# Patient Record
Sex: Male | Born: 1965 | Race: Black or African American | Hispanic: No | Marital: Married | Smoking: Current every day smoker
Health system: Southern US, Community
[De-identification: ages and names within clinical notes are randomized; demographics above are authoritative.]

## PROBLEM LIST (undated history)

## (undated) DIAGNOSIS — E119 Type 2 diabetes mellitus without complications: Secondary | ICD-10-CM

## (undated) DIAGNOSIS — I10 Essential (primary) hypertension: Secondary | ICD-10-CM

---

## 2009-06-10 ENCOUNTER — Emergency Department: Payer: Self-pay | Admitting: Internal Medicine

## 2014-09-29 ENCOUNTER — Emergency Department
Admission: EM | Admit: 2014-09-29 | Discharge: 2014-09-29 | Disposition: A | Discharge status: Home / Self Care | Payer: BLUE CROSS/BLUE SHIELD | Attending: Emergency Medicine | Admitting: Emergency Medicine

## 2014-09-29 ENCOUNTER — Encounter: Payer: Self-pay | Admitting: Emergency Medicine

## 2014-09-29 DIAGNOSIS — I10 Essential (primary) hypertension: Secondary | ICD-10-CM | POA: Diagnosis not present

## 2014-09-29 DIAGNOSIS — S99922A Unspecified injury of left foot, initial encounter: Secondary | ICD-10-CM | POA: Diagnosis present

## 2014-09-29 DIAGNOSIS — Y998 Other external cause status: Secondary | ICD-10-CM | POA: Insufficient documentation

## 2014-09-29 DIAGNOSIS — S90812A Abrasion, left foot, initial encounter: Secondary | ICD-10-CM | POA: Insufficient documentation

## 2014-09-29 DIAGNOSIS — Y9289 Other specified places as the place of occurrence of the external cause: Secondary | ICD-10-CM | POA: Insufficient documentation

## 2014-09-29 DIAGNOSIS — W5559XA Other contact with raccoon, initial encounter: Secondary | ICD-10-CM | POA: Diagnosis not present

## 2014-09-29 DIAGNOSIS — Y9389 Activity, other specified: Secondary | ICD-10-CM | POA: Insufficient documentation

## 2014-09-29 DIAGNOSIS — Z23 Encounter for immunization: Secondary | ICD-10-CM | POA: Diagnosis not present

## 2014-09-29 DIAGNOSIS — Z720 Tobacco use: Secondary | ICD-10-CM | POA: Insufficient documentation

## 2014-09-29 DIAGNOSIS — E119 Type 2 diabetes mellitus without complications: Secondary | ICD-10-CM | POA: Insufficient documentation

## 2014-09-29 DIAGNOSIS — Z203 Contact with and (suspected) exposure to rabies: Secondary | ICD-10-CM

## 2014-09-29 HISTORY — DX: Essential (primary) hypertension: I10

## 2014-09-29 HISTORY — DX: Type 2 diabetes mellitus without complications: E11.9

## 2014-09-29 MED ORDER — RABIES VACCINE, PCEC IM SUSR
1.0000 mL | Freq: Once | INTRAMUSCULAR | Status: AC
  Administered 2014-09-29: 1 mL via INTRAMUSCULAR
  Filled 2014-09-29: qty 1

## 2014-09-29 MED ORDER — RABIES IMMUNE GLOBULIN 150 UNIT/ML IM INJ
20.0000 [IU]/kg | INJECTION | Freq: Once | INTRAMUSCULAR | Status: AC
  Administered 2014-09-29: 2325 [IU] via INTRAMUSCULAR
  Filled 2014-09-29: qty 15.5

## 2014-09-29 NOTE — ED Provider Notes (Signed)
[facility] Emergency Department Provider Note ____________________________________________  Time seen: Approximately 11:29 AM  I have reviewed the triage vital signs and the nursing notes.   HISTORY  Chief Complaint Animal Bite   HPI Fernando Evans is a 49 y.o. male who presents to the emergency department for evaluation after being scratched/bitten by a raccoon last night while working on a vehicle. He states that he was laying on the ground with his foot extended out toward a bush and felt something grab his foot. He initially thought it was his son playing but then realized that something was trying to bite him. He was wearing socks but felt something scratch his left foot. He doesn't know whether it was a tooth or a claw. He states he then grabbed a milk crate and killed the raccoon. Animal control was contacted and they now have the animal for testing.   Past Medical History  Diagnosis Date  . Hypertension   . Diabetes mellitus without complication     There are no active problems to display for this patient.   History reviewed. No pertinent past surgical history.  No current outpatient prescriptions on file.  Allergies Review of patient's allergies indicates no known allergies.  No family history on file.  Social History Social History  Substance Use Topics  . Smoking status: Current Every Day Smoker  . Smokeless tobacco: None  . Alcohol Use: Yes    Review of Systems   Constitutional: No fever/chills Eyes: No visual changes. ENT: No congestion or rhinorrhea Cardiovascular: Denies chest pain. Respiratory: Denies shortness of breath. Gastrointestinal: No abdominal pain.  No nausea, no vomiting.  No diarrhea.  No constipation. Genitourinary: Negative for dysuria. Musculoskeletal: Negative for back pain. Skin: Abrasion to the left foot Neurological: Negative for headaches, focal weakness or numbness.  10-point ROS otherwise  negative.  ____________________________________________   PHYSICAL EXAM:  VITAL SIGNS: ED Triage Vitals  Enc Vitals Group     BP --      Pulse --      Resp --      Temp --      Temp src --      SpO2 --      Weight 09/29/14 1105 254 lb (115.214 kg)     Height 09/29/14 1105  (1.753 m)     Head Cir --      Peak Flow --      Pain Score 09/29/14 0953 0     Pain Loc --      Pain Edu? --      Excl. in GC? --     Constitutional: Alert and oriented. Well appearing and in no acute distress. Eyes: Conjunctivae are normal. PERRL. EOMI. Head: Atraumatic. Nose: No congestion/rhinnorhea. Mouth/Throat: Mucous membranes are moist.  Oropharynx non-erythematous. No oral lesions. Neck: No stridor. Cardiovascular: Normal rate, regular rhythm.  Good peripheral circulation. Respiratory: Normal respiratory effort.  No retractions. Lungs CTAB. Gastrointestinal: Soft and nontender. No distention.  Musculoskeletal: No lower extremity tenderness nor edema.  No joint effusions. Neurologic:  Normal speech and language. No gross focal neurologic deficits are appreciated. Speech is normal. No gait instability. Skin: Abrasion to the  dorsal lateral aspect of the left foot without appearance of infection or cellulitis.  Psychiatric: Mood and affect are normal. Speech and behavior are normal.  ____________________________________________   LABS (all labs ordered are listed, but only abnormal results are displayed)  Labs Reviewed - No data to display ____________________________________________  EKG  ____________________________________________  RADIOLOGY  Not indicated ____________________________________________   PROCEDURES  Procedure(s) performed: None ____________________________________________   INITIAL IMPRESSION / ASSESSMENT AND PLAN / ED COURSE  Pertinent labs & imaging results that were available during my care of the patient were reviewed by me and considered in my  medical decision making (see chart for details).  Patient to start rabies series. He was advised to return in 4 days for the second injection unless animal control notifies him that the animal was not rabid. He was also advised to observe the abrasion area for any sign of infection and return to the emergency department or see her primary care provider if he has concerns. ____________________________________________   FINAL CLINICAL IMPRESSION(S) / ED DIAGNOSES  Final diagnoses:  Need for post exposure prophylaxis for rabies       Chinita Pester, FNP 09/29/14 1134  Jene Every, MD 09/29/14 1309

## 2014-09-29 NOTE — ED Notes (Signed)
States he felt a raccoon bite to left foot yesterday    Here for possible rabies vaccine

## 2014-09-29 NOTE — Discharge Instructions (Signed)
Rabies  Rabies is a viral infection that can be spread to people from infected animals. The infection affects the brain and central nervous system. Once the disease develops, it almost always causes death. Because of this, when a person is bitten by an animal that may have rabies, treatment to prevent rabies often needs to be started whether or not the animal is known to be infected. Prompt treatment with the rabies vaccine and rabies immune globulin is very effective at preventing the infection from developing in people who have been exposed to the rabies virus. CAUSES  Rabies is caused by a virus that lives inside some animals. When a person is bitten by an infected animal, the rabies virus is spread to the person through the infected spit (saliva) of the animal. This virus can be carried by animals such as dogs, cats, skunks, bats, woodchucks, raccoons, coyotes, and foxes. SYMPTOMS  By the time symptoms appear, rabies is usually fatal for the person. Common symptoms include:  Headache.  Fever.  Fatigue and weakness.  Agitation.  Anxiety.  Confusion.  Unusual behavior, such as hyperactivity, fear of water (hydrophobia), or fear of air (aerophobia).  Hallucinations.  Insomnia.  Weakness in the arms or legs.  Difficulty swallowing. Most people get sick in 1-3 months after being bitten. This often varies and may depend on the location of the bite. The infection will take less time to develop if the bite occurred closer to the head.  DIAGNOSIS  To determine if a person is infected, several tests must be performed, such as:  A skin biopsy.  A saliva test.  A lumbar puncture to remove spinal fluid so it can be examined.  Blood tests. TREATMENT  Treatment to prevent the infection from developing (post-exposure prophylaxis, PEP) is often started before knowing for sure if the person has been exposed to the rabies virus. PEP involves cleaning the wound, giving an antibody injection  (rabies immune globulin), and giving a series of rabies vaccine injections. The series of injections are usually given over a two-week period. If possible, the animal that bit the person will be observed to see if it remains healthy. If the animal has been killed, it can be sent to a state laboratory and examined to see if the animal had rabies. If a person is bitten by a domestic animal (dog, cat, or ferret) that appears healthy and can be observed to see if it remains healthy, often no further treatment is necessary other than care of the wounds caused by the animal. Rabies is often a fatal illness once the infection develops in a person. Although a few people who developed rabies have survived after experimental treatment with certain drugs, all these survivors still had severe nervous system problems after the treatment. This is why caregivers use extra caution and begin PEP treatment for people who have been bitten by animals that are possibly infected with rabies.  HOME CARE INSTRUCTIONS  If you were bitten by an unknown animal, make sure you know your caregiver's instructions for follow-up. If the animal was sent to a laboratory for examination, ask when the test results will be ready. Make sure you get the test results.  Take these steps to care for your wound:  Keep the wound clean, dry, and dressed as directed by your caregiver.  Keep the injured part elevated as much as possible.  Do not resume use of the affected area until directed.  Only take over-the-counter or prescription medicines as directed by   your caregiver.  Keep all follow-up appointments as directed by your caregiver. PREVENTION  To prevent rabies, people need to reduce their risk of having contact with infected animals.   Make sure your pets (dogs, cats, ferrets) are vaccinated against rabies. Keep these vaccinations up-to-date as directed by your veterinarian.  Supervise your pets when they are outside. Keep them away  from wild animals.  Call your local animal control services to report any stray animals. These animals may not be vaccinated.  Stay away from stray or wild animals.  Consider getting the rabies vaccine (preexposure) if you are traveling to an area where rabies is common or if your job or activities involve possible contact with wild or stray animals. Discuss this with your caregiver. Document Released: 02/03/2005 Document Revised: 10/29/2011 Document Reviewed: 09/02/2011 Exit[employer] Patient Information 2015 [employer]. This information is not intended to replace advice given to you by your health care provider. Make sure you discuss any questions you have with your health care provider.  

## 2014-09-29 NOTE — ED Notes (Signed)
Pt to ed with c/o raccoon bite to left foot yesterday about 730 pm at night.

## 2014-10-02 ENCOUNTER — Ambulatory Visit
Admission: EM | Admit: 2014-10-02 | Discharge: 2014-10-02 | Disposition: A | Discharge status: Home / Self Care | Payer: BLUE CROSS/BLUE SHIELD | Attending: Family Medicine | Admitting: Family Medicine

## 2014-10-02 DIAGNOSIS — Z203 Contact with and (suspected) exposure to rabies: Secondary | ICD-10-CM

## 2014-10-02 MED ORDER — RABIES VACCINE, PCEC IM SUSR
1.0000 mL | Freq: Once | INTRAMUSCULAR | Status: AC
  Administered 2014-10-02: 1 mL via INTRAMUSCULAR

## 2014-10-02 NOTE — ED Notes (Signed)
For Day 3 Rabies Vaccine

## 2014-10-02 NOTE — Discharge Instructions (Signed)
Please return August 19th, 2016 for day 7 of rabies injection series

## 2014-10-06 ENCOUNTER — Encounter: Payer: Self-pay | Admitting: Gynecology

## 2014-10-06 ENCOUNTER — Ambulatory Visit
Admission: EM | Admit: 2014-10-06 | Discharge: 2014-10-06 | Disposition: A | Discharge status: Home / Self Care | Payer: BLUE CROSS/BLUE SHIELD | Attending: Family Medicine | Admitting: Family Medicine

## 2014-10-06 MED ORDER — RABIES VACCINE, PCEC IM SUSR
1.0000 mL | Freq: Once | INTRAMUSCULAR | Status: AC
  Administered 2014-10-06: 1 mL via INTRAMUSCULAR

## 2014-10-06 NOTE — ED Notes (Signed)
For day 7 rabies vaccination.

## 2014-10-16 ENCOUNTER — Ambulatory Visit
Admission: EM | Admit: 2014-10-16 | Discharge: 2014-10-16 | Disposition: A | Discharge status: Home / Self Care | Payer: BLUE CROSS/BLUE SHIELD | Attending: Family Medicine | Admitting: Family Medicine

## 2014-10-16 MED ORDER — RABIES VACCINE, PCEC IM SUSR
1.0000 mL | Freq: Once | INTRAMUSCULAR | Status: AC
  Administered 2014-10-16: 1 mL via INTRAMUSCULAR

## 2014-10-16 NOTE — ED Notes (Signed)
Bit/scratched August 11th and went to [facility] er and started Rabies series on August 12th. Came to [facility] for Day 3 and Day 7, Day 7 was August 19th. States unsure of when was suppose to return for injection after August 19th. Pt. States was informed the racoon that bit patient was + rabid. Dr. Thurmond Butts gave order for Rabavert to be given today (Day 17).

## 2014-10-16 NOTE — ED Notes (Signed)
Family at bedside. Pt instructed to contact [facility] tomorrow August 30th for instructions as to if and when to receive next Rabavert (Rabies injection). Today was Day 17. Pt. Missed Day 14-calling for clarification

## 2014-10-17 ENCOUNTER — Telehealth: Payer: Self-pay

## 2014-10-17 NOTE — ED Notes (Signed)
This Nurse contacted [facility] and spoke with Charlyne Petrin at [phone]. She spoke with a collegue and they agree that there was not enough deviation in time from when was suppose to get Day 14 Rabies and instead came to [facility] and was given Tabavert on Day 17. No further action-no further Rabies injections required. Pt. Contacted at [phone] and relayed that information.

## 2015-10-25 ENCOUNTER — Emergency Department
Admission: EM | Admit: 2015-10-25 | Discharge: 2015-10-25 | Disposition: A | Discharge status: Home / Self Care | Payer: BLUE CROSS/BLUE SHIELD | Attending: Emergency Medicine | Admitting: Emergency Medicine

## 2015-10-25 ENCOUNTER — Emergency Department: Payer: BLUE CROSS/BLUE SHIELD

## 2015-10-25 DIAGNOSIS — E1165 Type 2 diabetes mellitus with hyperglycemia: Secondary | ICD-10-CM | POA: Diagnosis not present

## 2015-10-25 DIAGNOSIS — Z5181 Encounter for therapeutic drug level monitoring: Secondary | ICD-10-CM | POA: Insufficient documentation

## 2015-10-25 DIAGNOSIS — I10 Essential (primary) hypertension: Secondary | ICD-10-CM | POA: Diagnosis not present

## 2015-10-25 DIAGNOSIS — F17200 Nicotine dependence, unspecified, uncomplicated: Secondary | ICD-10-CM | POA: Diagnosis not present

## 2015-10-25 DIAGNOSIS — R42 Dizziness and giddiness: Secondary | ICD-10-CM | POA: Insufficient documentation

## 2015-10-25 DIAGNOSIS — Z7984 Long term (current) use of oral hypoglycemic drugs: Secondary | ICD-10-CM | POA: Insufficient documentation

## 2015-10-25 DIAGNOSIS — R739 Hyperglycemia, unspecified: Secondary | ICD-10-CM

## 2015-10-25 LAB — CBC
HCT: 44.1 % (ref 40.0–52.0)
Hemoglobin: 15.2 g/dL (ref 13.0–18.0)
MCH: 30.4 pg (ref 26.0–34.0)
MCHC: 34.5 g/dL (ref 32.0–36.0)
MCV: 88.3 fL (ref 80.0–100.0)
Platelets: 246 10*3/uL (ref 150–440)
RBC: 5 MIL/uL (ref 4.40–5.90)
RDW: 13.8 % (ref 11.5–14.5)
WBC: 8.7 10*3/uL (ref 3.8–10.6)

## 2015-10-25 LAB — PROTIME-INR
INR: 0.97
Prothrombin Time: 12.9 seconds (ref 11.4–15.2)

## 2015-10-25 LAB — BASIC METABOLIC PANEL
ANION GAP: 5 (ref 5–15)
BUN: 9 mg/dL (ref 6–20)
CALCIUM: 9 mg/dL (ref 8.9–10.3)
CO2: 32 mmol/L (ref 22–32)
CREATININE: 0.79 mg/dL (ref 0.61–1.24)
Chloride: 102 mmol/L (ref 101–111)
GFR calc Af Amer: >60 mL/min (ref >60)
GFR calc non Af Amer: >60 mL/min (ref >60)
Glucose, Bld: 228 mg/dL — ABNORMAL HIGH (ref 65–99)
Potassium: 4.3 mmol/L (ref 3.5–5.1)
SODIUM: 139 mmol/L (ref 135–145)

## 2015-10-25 MED ORDER — AMLODIPINE BESYLATE 10 MG PO TABS: 10.0000 mg | ORAL_TABLET | Freq: Every day | ORAL | 11 refills | Status: AC

## 2015-10-25 MED ORDER — ASPIRIN EC 325 MG PO TBEC: 325.0000 mg | DELAYED_RELEASE_TABLET | Freq: Every day | ORAL | 3 refills | Status: AC

## 2015-10-25 MED ORDER — ASPIRIN 81 MG PO CHEW
324.0000 mg | CHEWABLE_TABLET | Freq: Once | ORAL | Status: AC
  Administered 2015-10-25: 324 mg via ORAL
  Filled 2015-10-25: qty 4

## 2015-10-25 NOTE — ED Notes (Signed)
NAD noted at this time. Pt resting in bed. This RN explained delay to patient and apologized for patient wait. Pt states understanding at this time. Pt is alert and oriented, able to speak in full/complete sentences at this time. Will continue to monitor for further patient needs.

## 2015-10-25 NOTE — ED Provider Notes (Signed)
[facility] Emergency Department Provider Note        Time seen: ----------------------------------------- 3:07 PM on 10/25/2015 -----------------------------------------    I have reviewed the triage vital signs and the nursing notes.   HISTORY  Chief Complaint Hypertension and Dizziness    HPI Fernando Evans is a 50 y.o. male who presents to ER with elevated blood pressure and dizziness. Patient reports he had stopped his metoprolol, amlodipine and lisinopril for several months. Patient states 2 weeks ago he started back because he started feeling tingling on his scalp and he noticed some tingling in his left hand. He may have had some left facial paresthesias as well. Patient is also stopped his diabetes medicines. After restarting his medications 2 weeks ago he sayshis blood pressure was improved but then all of a sudden started increasing again. He denies any symptoms currently.   Past Medical History:  Diagnosis Date  . Diabetes mellitus without complication (HCC)   . Hypertension     There are no active problems to display for this patient.   No past surgical history on file.  Allergies Review of patient's allergies indicates no known allergies.  Social History Social History  Substance Use Topics  . Smoking status: Current Every Day Smoker  . Smokeless tobacco: Not on file  . Alcohol use Yes     Comment: socially    Review of Systems Constitutional: Negative for fever. Cardiovascular: Negative for chest pain. Respiratory: Negative for shortness of breath. Gastrointestinal: Negative for abdominal pain, vomiting and diarrhea. Genitourinary: Negative for dysuria. Musculoskeletal: Negative for back pain. Skin: Negative for rash. Neurological: Negative for headaches,Positive for paresthesias  10-point ROS otherwise negative.  ____________________________________________   PHYSICAL EXAM:  VITAL SIGNS: ED Triage Vitals  Enc  Vitals Group     BP 10/25/15 1108 (!) 174/96     Pulse Rate 10/25/15 1108 74     Resp 10/25/15 1108 18     Temp 10/25/15 1108 98.7 F (37.1 C)     Temp Source 10/25/15 1108 Oral     SpO2 10/25/15 1108 98 %     Weight 10/25/15 1109 250 lb (113.4 kg)     Height 10/25/15 1109 5\' 10"  (1.778 m)     Head Circumference --      Peak Flow --      Pain Score 10/25/15 1112 2     Pain Loc --      Pain Edu? --      Excl. in GC? --     Constitutional: Alert and oriented. Well appearing and in no distress. Eyes: Conjunctivae are normal. PERRL. Normal extraocular movements. ENT   Head: Normocephalic and atraumatic.   Nose: No congestion/rhinnorhea.   Mouth/Throat: Mucous membranes are moist.   Neck: No stridor. Cardiovascular: Normal rate, regular rhythm. No murmurs, rubs, or gallops. Respiratory: Normal respiratory effort without tachypnea nor retractions. Breath sounds are clear and equal bilaterally. No wheezes/rales/rhonchi. Gastrointestinal: Soft and nontender. Normal bowel sounds Musculoskeletal: Nontender with normal range of motion in all extremities. No lower extremity tenderness nor edema. Neurologic:  Normal speech and language. No gross focal neurologic deficits are appreciated. Paresthesias noted in the C8 distribution left hand. Cranial nerves appear to be intact, strength is normal Skin:  Skin is warm, dry and intact. No rash noted. Psychiatric: Mood and affect are normal. Speech and behavior are normal.  ____________________________________________  EKG: Interpreted by me.Sinus rhythm rate of 74 bpm, normal PR interval, normal QRS, normal QT interval.  Normal axis. No evidence of acute infarction  ____________________________________________  ED COURSE:  Pertinent labs & imaging results that were available during my care of the patient were reviewed by me and considered in my medical decision making (see chart for details). Clinical Course  Patient is in no  distress, we will assess with basic labs and monitor his blood pressure. I will also perform a head CT to evaluate for CVA.  Procedures ____________________________________________   LABS (pertinent positives/negatives)  Labs Reviewed  BASIC METABOLIC PANEL - Abnormal; Notable for the following:       Result Value   Glucose, Bld 228 (*)    All other components within normal limits  CBC  PROTIME-INR    RADIOLOGY  CT head Is unremarkable ____________________________________________  FINAL ASSESSMENT AND PLAN  Hypertension, hyperglycemia  Plan: Patient with labs and imaging as dictated above. Patient being placed on an adult aspirin since he has been noncompliant with his blood pressure and diabetes medication. He also smokes. He does have outpatient follow-up scheduled with his doctor in a week. I will increase his blood pressure medicine as well. He is stable for outpatient follow-up.   Fernando Evans, Fernando E, MD   Note: This dictation was prepared with Dragon dictation. Any transcriptional errors that result from this process are unintentional    Fernando FilbertJonathan E Williams, MD 10/25/15 [phone]

## 2015-10-25 NOTE — ED Notes (Signed)
MD Mayford KnifeWilliams made aware of pts BP.  MD okayed DC of pt.  Pt instructed to take rx'd medications and follow up w/ PCP for BP.

## 2015-10-25 NOTE — ED Triage Notes (Addendum)
Pt here with reports of elevated BP and dizziness  Pt take metoprolol, amlodipine and lisinopril for BP but he states it has remained elevated   Pt also reports that at times he feels like something is "tingling" in his head with a slight headache

## 2015-10-25 NOTE — ED Notes (Signed)
Pt taken to CT at this time.

## 2016-05-24 ENCOUNTER — Emergency Department
Admission: EM | Admit: 2016-05-24 | Discharge: 2016-05-24 | Disposition: A | Discharge status: Home / Self Care | Payer: BLUE CROSS/BLUE SHIELD | Attending: Emergency Medicine | Admitting: Emergency Medicine

## 2016-05-24 ENCOUNTER — Encounter: Payer: Self-pay | Admitting: Emergency Medicine

## 2016-05-24 DIAGNOSIS — Z79899 Other long term (current) drug therapy: Secondary | ICD-10-CM | POA: Insufficient documentation

## 2016-05-24 DIAGNOSIS — Z7982 Long term (current) use of aspirin: Secondary | ICD-10-CM | POA: Insufficient documentation

## 2016-05-24 DIAGNOSIS — Z7984 Long term (current) use of oral hypoglycemic drugs: Secondary | ICD-10-CM | POA: Diagnosis not present

## 2016-05-24 DIAGNOSIS — E119 Type 2 diabetes mellitus without complications: Secondary | ICD-10-CM | POA: Insufficient documentation

## 2016-05-24 DIAGNOSIS — I10 Essential (primary) hypertension: Secondary | ICD-10-CM | POA: Diagnosis not present

## 2016-05-24 DIAGNOSIS — N492 Inflammatory disorders of scrotum: Secondary | ICD-10-CM | POA: Diagnosis not present

## 2016-05-24 DIAGNOSIS — F17210 Nicotine dependence, cigarettes, uncomplicated: Secondary | ICD-10-CM | POA: Diagnosis not present

## 2016-05-24 MED ORDER — HYDROCODONE-ACETAMINOPHEN 5-325 MG PO TABS: 1.0000 | ORAL_TABLET | ORAL | 0 refills | Status: AC | PRN

## 2016-05-24 MED ORDER — SULFAMETHOXAZOLE-TRIMETHOPRIM 800-160 MG PO TABS: 1.0000 | ORAL_TABLET | Freq: Two times a day (BID) | ORAL | 0 refills | Status: AC

## 2016-05-24 MED ORDER — LIDOCAINE HCL (PF) 1 % IJ SOLN: 10.0000 mL | Freq: Once | INTRAMUSCULAR | Status: DC

## 2016-05-24 MED ORDER — LIDOCAINE HCL (PF) 1 % IJ SOLN
INTRAMUSCULAR | Status: AC
  Filled 2016-05-24: qty 10

## 2016-05-24 NOTE — Discharge Instructions (Signed)
Remove the packing in 2 days and repack if there is still drainage. If drainage continues for more than 4 days total, follow up with primary care or return to the ER for a recheck. Take the antibiotic until finished.

## 2016-05-24 NOTE — ED Triage Notes (Signed)
States abscess scrotum x 3 to 4 days. States had small amount of drainage.

## 2016-05-24 NOTE — ED Notes (Signed)
Pt. Going home with familly. 

## 2016-05-24 NOTE — ED Provider Notes (Signed)
[facility] Emergency Department Provider Note  ____________________________________________  Time seen: Approximately 6:27 PM  I have reviewed the triage vital signs and the nursing notes.   HISTORY  Chief Complaint Abscess   HPI Fernando Evans is a 51 y.o. male who presents to the emergency department for evaluation of scrotal abscess for the past 3-4 days. He denies fever. He has had a small amount of drainage. He has had an abscess in the same area a few years ago that required I&D. He has a history of diabetes and states his blood sugar has "varied" as of late.  Past Medical History:  Diagnosis Date  . Diabetes mellitus without complication (HCC)   . Hypertension     There are no active problems to display for this patient.   History reviewed. No pertinent surgical history.  Prior to Admission medications   Medication Sig Start Date End Date Taking? Authorizing Provider  amLODipine (NORVASC) 10 MG tablet Take 1 tablet (10 mg total) by mouth daily. 10/25/15 10/24/16  Emily Filbert, MD  aspirin EC 325 MG tablet Take 1 tablet (325 mg total) by mouth daily. 10/25/15 10/24/16  Emily Filbert, MD  HYDROcodone-acetaminophen (NORCO/VICODIN) 5-325 MG tablet Take 1 tablet by mouth every 4 (four) hours as needed for moderate pain. 05/24/16 05/24/17  Chinita Pester, FNP  metFORMIN (GLUCOPHAGE) 1000 MG tablet Take 1,000 mg by mouth 2 (two) times daily with a meal.    Historical Provider, MD  sulfamethoxazole-trimethoprim (BACTRIM DS,SEPTRA DS) 800-160 MG tablet Take 1 tablet by mouth 2 (two) times daily. 05/24/16   Chinita Pester, FNP    Allergies Patient has no known allergies.  Family History  Problem Relation Age of Onset  . Sarcoidosis Mother   . Kidney disease Father     Social History Social History  Substance Use Topics  . Smoking status: Current Every Day Smoker    Packs/day: 0.50    Types: Cigarettes  . Smokeless tobacco: Not on file  .  Alcohol use Yes     Comment: socially    Review of Systems  Constitutional: Negative for fever/chills Respiratory: Negative for shortness of breath. Musculoskeletal: Negative for pain. Skin: Positive for abscess Neurological: Negative for headaches, focal weakness or numbness. ____________________________________________   PHYSICAL EXAM:  VITAL SIGNS: ED Triage Vitals  Enc Vitals Group     BP 05/24/16 1753 (!) 159/94     Pulse Rate 05/24/16 1753 (!) 102     Resp 05/24/16 1753 18     Temp 05/24/16 1753 98.5 F (36.9 C)     Temp Source 05/24/16 1753 Oral     SpO2 05/24/16 1753 99 %     Weight 05/24/16 1755 240 lb (108.9 kg)     Height 05/24/16 1755  (1.778 m)     Head Circumference --      Peak Flow --      Pain Score 05/24/16 1753 10     Pain Loc --      Pain Edu? --      Excl. in GC? --      Constitutional: Alert and oriented. Well appearing and in no acute distress. Eyes: Conjunctivae are normal. EOMI. Nose: No congestion/rhinnorhea. Mouth/Throat: Mucous membranes are moist.   Respiratory: Normal respiratory effort.  No retractions. Musculoskeletal: FROM throughout. Neurologic:  Normal speech and language. No gross focal neurologic deficits are appreciated. Skin:  Fluctuant, erythematous, tender area over the scrotal wall on the right side.  ____________________________________________   LABS (all labs ordered are listed, but only abnormal results are displayed)  Labs Reviewed - No data to display ____________________________________________  EKG   ____________________________________________  RADIOLOGY  Not indicated. ____________________________________________   PROCEDURES  Procedure(s) performed: INCISION AND DRAINAGE Performed by: Kem Boroughs Consent: Verbal consent obtained. Risks and benefits: risks, benefits and alternatives were discussed Type: abscess  Body area: right scrotal wall  Anesthesia: local  infiltration  Incision was made with a scalpel.  Local anesthetic: lidocaine 1% without epinephrine  Anesthetic total: 6 ml  Complexity: complex  Blunt dissection to break up loculations  Drainage: purulent  Drainage amount: large  Packing material: 1/4 in iodoform gauze  Patient tolerance: Patient tolerated the procedure well with no immediate complications.    ____________________________________________   INITIAL IMPRESSION / ASSESSMENT AND PLAN / ED COURSE  51 year old male presenting to the emergency department for evaluation and treatment of scrotal wall abscess. Incision and drainage performed with results of malodorous, purulent drainage. Wife instructed to repack in 2 days if drainage continues. If drainage continues for 2 days after repacked he is to follow up with primary care or return to the ER for recheck. Prescriptions for bactrim and norco written. He was also advised to monitor his glucose and adhere to a diabetic diet.   Pertinent labs & imaging results that were available during my care of the patient were reviewed by me and considered in my medical decision making (see chart for details).   ____________________________________________   FINAL CLINICAL IMPRESSION(S) / ED DIAGNOSES  Final diagnoses:  Abscess of scrotal wall    New Prescriptions   HYDROCODONE-ACETAMINOPHEN (NORCO/VICODIN) 5-325 MG TABLET    Take 1 tablet by mouth every 4 (four) hours as needed for moderate pain.   SULFAMETHOXAZOLE-TRIMETHOPRIM (BACTRIM DS,SEPTRA DS) 800-160 MG TABLET    Take 1 tablet by mouth 2 (two) times daily.    If controlled substance prescribed during this visit, 12 month history viewed on the [facility] prior to issuing an initial prescription for Schedule II or III opiod.   Note:  This document was prepared using Dragon voice recognition software and may include unintentional dictation errors.    Chinita Pester, FNP 05/24/16 1924    Merrily Brittle,  MD 05/25/16 1419

## 2016-05-27 ENCOUNTER — Inpatient Hospital Stay
Admission: EM | Admit: 2016-05-27 | Discharge: 2016-05-27 | DRG: 872 | Discharge status: Against Medical Advice | Payer: BLUE CROSS/BLUE SHIELD | Attending: Specialist | Admitting: Specialist

## 2016-05-27 ENCOUNTER — Emergency Department: Payer: BLUE CROSS/BLUE SHIELD

## 2016-05-27 ENCOUNTER — Encounter: Payer: Self-pay | Admitting: Emergency Medicine

## 2016-05-27 DIAGNOSIS — Z7982 Long term (current) use of aspirin: Secondary | ICD-10-CM | POA: Diagnosis not present

## 2016-05-27 DIAGNOSIS — Z7984 Long term (current) use of oral hypoglycemic drugs: Secondary | ICD-10-CM

## 2016-05-27 DIAGNOSIS — F17210 Nicotine dependence, cigarettes, uncomplicated: Secondary | ICD-10-CM | POA: Diagnosis present

## 2016-05-27 DIAGNOSIS — R52 Pain, unspecified: Secondary | ICD-10-CM

## 2016-05-27 DIAGNOSIS — E876 Hypokalemia: Secondary | ICD-10-CM | POA: Diagnosis present

## 2016-05-27 DIAGNOSIS — A419 Sepsis, unspecified organism: Principal | ICD-10-CM | POA: Diagnosis present

## 2016-05-27 DIAGNOSIS — Z836 Family history of other diseases of the respiratory system: Secondary | ICD-10-CM | POA: Diagnosis not present

## 2016-05-27 DIAGNOSIS — L0291 Cutaneous abscess, unspecified: Secondary | ICD-10-CM

## 2016-05-27 DIAGNOSIS — I10 Essential (primary) hypertension: Secondary | ICD-10-CM | POA: Diagnosis present

## 2016-05-27 DIAGNOSIS — E119 Type 2 diabetes mellitus without complications: Secondary | ICD-10-CM | POA: Diagnosis present

## 2016-05-27 DIAGNOSIS — Z841 Family history of disorders of kidney and ureter: Secondary | ICD-10-CM

## 2016-05-27 DIAGNOSIS — R509 Fever, unspecified: Secondary | ICD-10-CM | POA: Diagnosis not present

## 2016-05-27 DIAGNOSIS — N492 Inflammatory disorders of scrotum: Secondary | ICD-10-CM | POA: Diagnosis present

## 2016-05-27 DIAGNOSIS — R609 Edema, unspecified: Secondary | ICD-10-CM

## 2016-05-27 LAB — URINALYSIS, COMPLETE (UACMP) WITH MICROSCOPIC
BACTERIA UA: NONE SEEN
Bilirubin Urine: NEGATIVE
Hgb urine dipstick: NEGATIVE
Ketones, ur: 5 mg/dL — AB
LEUKOCYTES UA: NEGATIVE
Nitrite: NEGATIVE
Protein, ur: NEGATIVE mg/dL
Specific Gravity, Urine: 1.022 (ref 1.005–1.030)
Squamous Epithelial / LPF: NONE SEEN
pH: 5 (ref 5.0–8.0)

## 2016-05-27 LAB — COMPREHENSIVE METABOLIC PANEL
ALBUMIN: 3.6 g/dL (ref 3.5–5.0)
ALT: 22 U/L (ref 17–63)
AST: 22 U/L (ref 15–41)
Alkaline Phosphatase: 78 U/L (ref 38–126)
Anion gap: 10 (ref 5–15)
BILIRUBIN TOTAL: 0.5 mg/dL (ref 0.3–1.2)
BUN: 20 mg/dL (ref 6–20)
CHLORIDE: 93 mmol/L — AB (ref 101–111)
CO2: 28 mmol/L (ref 22–32)
Calcium: 9.2 mg/dL (ref 8.9–10.3)
Creatinine, Ser: 1.19 mg/dL (ref 0.61–1.24)
GFR calc Af Amer: >60 mL/min (ref >60)
GFR calc non Af Amer: >60 mL/min (ref >60)
GLUCOSE: 279 mg/dL — AB (ref 65–99)
POTASSIUM: 3.2 mmol/L — AB (ref 3.5–5.1)
SODIUM: 131 mmol/L — AB (ref 135–145)
TOTAL PROTEIN: 8 g/dL (ref 6.5–8.1)

## 2016-05-27 LAB — CBC WITH DIFFERENTIAL/PLATELET
BASOS ABS: 0.1 10*3/uL (ref 0–0.1)
Basophils Relative: 1 %
EOS PCT: 1 %
Eosinophils Absolute: 0.1 10*3/uL (ref 0–0.7)
HEMATOCRIT: 41 % (ref 40.0–52.0)
Hemoglobin: 14.2 g/dL (ref 13.0–18.0)
LYMPHS ABS: 2.9 10*3/uL (ref 1.0–3.6)
Lymphocytes Relative: 19 %
MCH: 30.1 pg (ref 26.0–34.0)
MCHC: 34.5 g/dL (ref 32.0–36.0)
MCV: 87.2 fL (ref 80.0–100.0)
MONO ABS: 1.5 10*3/uL — AB (ref 0.2–1.0)
Monocytes Relative: 10 %
NEUTROS ABS: 10.7 10*3/uL — AB (ref 1.4–6.5)
Neutrophils Relative %: 69 %
PLATELETS: 329 10*3/uL (ref 150–440)
RBC: 4.7 MIL/uL (ref 4.40–5.90)
RDW: 13.5 % (ref 11.5–14.5)
WBC: 15.2 10*3/uL — ABNORMAL HIGH (ref 3.8–10.6)

## 2016-05-27 LAB — LACTIC ACID, PLASMA
LACTIC ACID, VENOUS: 1.6 mmol/L (ref 0.5–1.9)
LACTIC ACID, VENOUS: 1.8 mmol/L (ref 0.5–1.9)

## 2016-05-27 MED ORDER — LIDOCAINE HCL (PF) 1 % IJ SOLN
INTRAMUSCULAR | Status: AC
  Administered 2016-05-27: 10 mL via INTRADERMAL
  Filled 2016-05-27: qty 10

## 2016-05-27 MED ORDER — VANCOMYCIN HCL 10 G IV SOLR
1500.0000 mg | Freq: Two times a day (BID) | INTRAVENOUS | Status: DC
  Filled 2016-05-27 (×2): qty 1500

## 2016-05-27 MED ORDER — LIDOCAINE HCL (PF) 1 % IJ SOLN
10.0000 mL | Freq: Once | INTRAMUSCULAR | Status: AC
  Administered 2016-05-27: 10 mL via INTRADERMAL

## 2016-05-27 MED ORDER — INSULIN ASPART 100 UNIT/ML ~~LOC~~ SOLN
0.0000 [IU] | Freq: Three times a day (TID) | SUBCUTANEOUS | Status: DC
  Filled 2016-05-27 (×3): qty 0.09

## 2016-05-27 MED ORDER — INSULIN ASPART 100 UNIT/ML ~~LOC~~ SOLN: 0.0000 [IU] | Freq: Every day | SUBCUTANEOUS | Status: DC

## 2016-05-27 MED ORDER — SODIUM CHLORIDE 0.9 % IV BOLUS (SEPSIS)
1000.0000 mL | Freq: Once | INTRAVENOUS | Status: AC
  Administered 2016-05-27: 1000 mL via INTRAVENOUS

## 2016-05-27 MED ORDER — VANCOMYCIN HCL IN DEXTROSE 1-5 GM/200ML-% IV SOLN
1000.0000 mg | Freq: Once | INTRAVENOUS | Status: AC
  Administered 2016-05-27: 1000 mg via INTRAVENOUS
  Filled 2016-05-27 (×2): qty 200

## 2016-05-27 MED ORDER — PIPERACILLIN-TAZOBACTAM 3.375 G IVPB
3.3750 g | Freq: Three times a day (TID) | INTRAVENOUS | Status: DC
  Filled 2016-05-27 (×3): qty 50

## 2016-05-27 NOTE — ED Notes (Signed)
Dr. Derrill Kay spoke with the patient. Patient decided to leave AMA.

## 2016-05-27 NOTE — Consult Note (Signed)
Reason for Consult: Scrotal Abscess  Referring Physician: Abel Presto MD  Fernando Evans is an 51 y.o. male.   HPI:   1 - Scrotal Abscess - pt with right sided scrotal abscess x approx 7 days. Had bedside lancing in ER on 4/7 and placed on empiric bactrim but pt reports has worsened. Denies high fevers but does admit to some low grade fevers and malaise. He is compliant diabetic. Had similar episode 2012 that required more formal I+D.  PMH sig for HTN, DM2 (no neuropathy).   Today "Fernando Evans" is seen in consultation for above.     Past Medical History:  Diagnosis Date  . Diabetes mellitus without complication ([place])   . Hypertension     History reviewed. No pertinent surgical history.  Family History  Problem Relation Age of Onset  . Sarcoidosis Mother   . Kidney disease Father     Social History:  reports that he has been smoking Cigarettes.  He has a 10.00 pack-year smoking history. He has never used smokeless tobacco. He reports that he drinks alcohol. He reports that he does not use drugs.  Allergies: No Known Allergies  Medications: I have reviewed the patient's current medications.  Results for orders placed or performed during the hospital encounter of 05/27/16 (from the past 48 hour(s))  Lactic acid, plasma     Status: None   Collection Time: 05/27/16  4:38 PM  Result Value Ref Range   Lactic Acid, Venous 1.6 0.5 - 1.9 mmol/L  Comprehensive metabolic panel     Status: Abnormal   Collection Time: 05/27/16  4:38 PM  Result Value Ref Range   Sodium 131 (L) 135 - 145 mmol/L   Potassium 3.2 (L) 3.5 - 5.1 mmol/L   Chloride 93 (L) 101 - 111 mmol/L   CO2 28 22 - 32 mmol/L   Glucose, Bld 279 (H) 65 - 99 mg/dL   BUN 20 6 - 20 mg/dL   Creatinine, Ser 1.19 0.61 - 1.24 mg/dL   Calcium 9.2 8.9 - 10.3 mg/dL   Total Protein 8.0 6.5 - 8.1 g/dL   Albumin 3.6 3.5 - 5.0 g/dL   AST 22 15 - 41 U/L   ALT 22 17 - 63 U/L   Alkaline Phosphatase 78 38 - 126 U/L   Total Bilirubin  0.5 0.3 - 1.2 mg/dL   GFR calc non Af Amer >60 >60 mL/min   GFR calc Af Amer >60 >60 mL/min    Comment: (NOTE) The eGFR has been calculated using the CKD EPI equation. This calculation has not been validated in all clinical situations. eGFR's persistently <60 mL/min signify possible Chronic Kidney Disease.    Anion gap 10 5 - 15  CBC with Differential     Status: Abnormal   Collection Time: 05/27/16  4:38 PM  Result Value Ref Range   WBC 15.2 (H) 3.8 - 10.6 K/uL   RBC 4.70 4.40 - 5.90 MIL/uL   Hemoglobin 14.2 13.0 - 18.0 g/dL   HCT 41.0 40.0 - 52.0 %   MCV 87.2 80.0 - 100.0 fL   MCH 30.1 26.0 - 34.0 pg   MCHC 34.5 32.0 - 36.0 g/dL   RDW 13.5 11.5 - 14.5 %   Platelets 329 150 - 440 K/uL   Neutrophils Relative % 69 %   Neutro Abs 10.7 (H) 1.4 - 6.5 K/uL   Lymphocytes Relative 19 %   Lymphs Abs 2.9 1.0 - 3.6 K/uL   Monocytes Relative 10 %  Monocytes Absolute 1.5 (H) 0.2 - 1.0 K/uL   Eosinophils Relative 1 %   Eosinophils Absolute 0.1 0 - 0.7 K/uL   Basophils Relative 1 %   Basophils Absolute 0.1 0 - 0.1 K/uL  Urinalysis, Complete w Microscopic     Status: Abnormal   Collection Time: 05/27/16  4:39 PM  Result Value Ref Range   Color, Urine YELLOW (A) YELLOW   APPearance CLEAR (A) CLEAR   Specific Gravity, Urine 1.022 1.005 - 1.030   pH 5.0 5.0 - 8.0   Glucose, UA >=500 (A) NEGATIVE mg/dL   Hgb urine dipstick NEGATIVE NEGATIVE   Bilirubin Urine NEGATIVE NEGATIVE   Ketones, ur 5 (A) NEGATIVE mg/dL   Protein, ur NEGATIVE NEGATIVE mg/dL   Nitrite NEGATIVE NEGATIVE   Leukocytes, UA NEGATIVE NEGATIVE   RBC / HPF 0-5 0 - 5 RBC/hpf   WBC, UA 0-5 0 - 5 WBC/hpf   Bacteria, UA NONE SEEN NONE SEEN   Squamous Epithelial / LPF NONE SEEN NONE SEEN   Mucous PRESENT    Hyaline Casts, UA PRESENT   Lactic acid, plasma     Status: None   Collection Time: 05/27/16  6:36 PM  Result Value Ref Range   Lactic Acid, Venous 1.8 0.5 - 1.9 mmol/L    US Scrotum  Result Date:  05/27/2016 CLINICAL DATA:  Initial evaluation for acute swelling, pain. Status post recent abscess drainage. EXAM: ULTRASOUND OF SCROTUM TECHNIQUE: Complete ultrasound examination of the testicles, epididymis, and other scrotal structures was performed. COMPARISON:  None. FINDINGS: Right testicle Measurements: 5.0 x 2.7 x 3.0 cm. No mass or microlithiasis visualized. Left testicle Measurements: 4.3 x 2.5 x 3.2 cm. No mass or microlithiasis visualized. Right epididymis: Normal in size. Mildly heterogeneous without discrete lesion. Left epididymis: Normal in size. Mildly heterogeneous without discrete lesion. Hydrocele:  None visualized. Varicocele:  Small bilateral varicoceles. At the lateral posterior aspect of the right scrotum at site of pain, there this complex echogenicity with scattered echogenic foci, likely gas. Changes suspected to be related to recent procedure/ abscess strain age. No discrete or drainable collection identified on today's exam. IMPRESSION: 1. Focal complex area at the right lateral/posterior aspect of the scrotum in region of recently drained abscess. Finding may reflect post interventional changes and/or infection. Scattered echogenic foci within this region likely reflect air, suspected to be related to recent procedure. No discrete or drainable collection identified on today's exam. 2. Bilateral varicoceles. 3. Normal sonographic appearance of the testes. Electronically Signed   By: Jeannine Boga M.D.   On: 05/27/2016 20:43   [place] Art/ven Flow Abd Pelv Doppler  Result Date: 05/27/2016 CLINICAL DATA:  Initial evaluation for acute swelling, pain. Status post recent abscess drainage. EXAM: ULTRASOUND OF SCROTUM TECHNIQUE: Complete ultrasound examination of the testicles, epididymis, and other scrotal structures was performed. COMPARISON:  None. FINDINGS: Right testicle Measurements: 5.0 x 2.7 x 3.0 cm. No mass or microlithiasis visualized. Left testicle Measurements: 4.3 x 2.5 x  3.2 cm. No mass or microlithiasis visualized. Right epididymis: Normal in size. Mildly heterogeneous without discrete lesion. Left epididymis: Normal in size. Mildly heterogeneous without discrete lesion. Hydrocele:  None visualized. Varicocele:  Small bilateral varicoceles. At the lateral posterior aspect of the right scrotum at site of pain, there this complex echogenicity with scattered echogenic foci, likely gas. Changes suspected to be related to recent procedure/ abscess strain age. No discrete or drainable collection identified on today's exam. IMPRESSION: 1. Focal complex area at the right lateral/posterior  aspect of the scrotum in region of recently drained abscess. Finding may reflect post interventional changes and/or infection. Scattered echogenic foci within this region likely reflect air, suspected to be related to recent procedure. No discrete or drainable collection identified on today's exam. 2. Bilateral varicoceles. 3. Normal sonographic appearance of the testes. Electronically Signed   By: Jeannine Boga M.D.   On: 05/27/2016 20:43    Review of Systems  Constitutional: Positive for malaise/fatigue.  HENT: Negative.   Eyes: Negative.   Respiratory: Negative.   Cardiovascular: Negative.   Gastrointestinal: Negative.   Genitourinary: Negative for dysuria, flank pain, hematuria and urgency.  Musculoskeletal: Negative.   Skin: Negative.   Neurological: Negative.   Endo/Heme/Allergies: Negative.   Psychiatric/Behavioral: Negative.    Blood pressure (!) 143/87, pulse (!) 112, temperature 99.1 F (37.3 C), temperature source Oral, resp. rate 16, height 5' 10"  (1.778 m), weight 107 kg (236 lb), SpO2 96 %. Physical Exam  Constitutional: He is oriented to person, place, and time. He appears well-developed.  Very pleasant. Wife and son at bedside in ER.   Eyes: Pupils are equal, round, and reactive to light.  Neck: Normal range of motion.  Respiratory: Effort normal.  GI: Soft.   Genitourinary: Penis normal.  Genitourinary Comments: fluctuent rt lateral wall scrotal abscess with two point of maximal fluctuence separated by abotu 2 inches. Inferior most draning throu pinhole opening. No tracking to rectum or ipsilateral testicle at this point.   Musculoskeletal: Normal range of motion.  Neurological: He is alert and oriented to person, place, and time.  Skin: Skin is warm.  Psychiatric: He has a normal mood and affect. His behavior is normal. Judgment and thought content normal.     Bedside Incision & Drainage:  Rt hemiscrotum prepped with iodine. 10cc 1% lidocaine used for local anesthetic alone two point of maximum fluctuance laterally. Cold incision performed x 1cm each site with efflux purulent material and some fibrinous debris which was removed. Cavity palpated and no tracking outside of scrotal skin. 4 inch segment of 1/4 inch penrose drain placed through and through two incisions with 1 inch exiting each site and each site anchored with nylon stitch. Excellent hemostasis. 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  Assessment/Plan:   1 - Scrotal Abscess - now s/p formal I+D today with drain placement to prevent early reformation. Drain to stay x 7-10 days, then office removal, we will arrange. Pending further CX data  (sample from today sent), I feel DC tomorrow with Augmentin x 14 days should be satisfactory as he did have clinical progression with bactrim.   Appreciate hospitalist comanagement.   Please call with questions.   Raul Torrance 05/27/2016, 9:17 PM

## 2016-05-27 NOTE — H&P (Signed)
[facility]   PATIENT NAME: Fernando Evans    MR#:  [id number]  DATE OF BIRTH:  Jul 01, 1965  DATE OF ADMISSION:  05/27/2016  PRIMARY CARE PHYSICIAN: [facility]   REQUESTING/REFERRING PHYSICIAN: Dr. Phineas Semen  CHIEF COMPLAINT:   Chief Complaint  Patient presents with  . Wound Check    HISTORY OF PRESENT ILLNESS:  Fernando Evans  is a 51 y.o. 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 Patient noticed a boil in his right side of his scrotum about a week ago and it continued to drain so he came to the ER a few days back and had a I&D done. Despite having an incision and drainage done and being started on oral Bactrim he continues to have significant drainage from the right scrotum and therefore came to the ER for further evaluation. In the ER patient was noted to have a leukocytosis, and therefore hospitalist services were contacted further treatment and evaluation. Patient denies any fevers, chills, nausea, vomiting, abdominal pain, chest pain, shortness of breath or any other associated symptoms presently.  PAST MEDICAL HISTORY:   Past Medical History:  Diagnosis Date  . Diabetes mellitus without complication (HCC)   . Hypertension     PAST SURGICAL HISTORY:  History reviewed. No pertinent surgical history.  SOCIAL HISTORY:   Social History  Substance Use Topics  . Smoking status: Current Every Day Smoker    Packs/day: 0.50    Years: 20.00    Types: Cigarettes  . Smokeless tobacco: Never Used  . Alcohol use Yes     Comment: socially    FAMILY HISTORY:   Family History  Problem Relation Age of Onset  . Sarcoidosis Mother   . Kidney disease Father     DRUG ALLERGIES:  No Known Allergies  REVIEW OF SYSTEMS:   Review of Systems  Constitutional: Negative for fever and weight loss.  HENT: Negative for congestion, nosebleeds and  tinnitus.   Eyes: Negative for blurred vision, double vision and redness.  Respiratory: Negative for cough, hemoptysis and shortness of breath.   Cardiovascular: Negative for chest pain, orthopnea, leg swelling and PND.  Gastrointestinal: Negative for abdominal pain, diarrhea, melena, nausea and vomiting.  Genitourinary: Negative for dysuria, hematuria and urgency.       Right sided scrotal drainage.    Musculoskeletal: Negative for falls and joint pain.  Neurological: Negative for dizziness, tingling, sensory change, focal weakness, seizures, weakness and headaches.  Endo/Heme/Allergies: Negative for polydipsia. Does not bruise/bleed easily.  Psychiatric/Behavioral: Negative for depression and memory loss. The patient is not nervous/anxious.     MEDICATIONS AT HOME:   Prior to Admission medications   Medication Sig Start Date End Date Taking? Authorizing Provider  amLODipine (NORVASC) 10 MG tablet Take 1 tablet (10 mg total) by mouth daily. 10/25/15 10/24/16 Yes Emily Filbert, MD  aspirin EC 81 MG tablet Take 81 mg by mouth daily.   Yes Historical Provider, MD  metFORMIN (GLUCOPHAGE) 1000 MG tablet Take 1,000 mg by mouth 2 (two) times daily with a meal.   Yes Historical Provider, MD  metoprolol tartrate (LOPRESSOR) 25 MG tablet Take 25 mg by mouth 2 (two) times daily.   Yes Historical Provider, MD  aspirin EC 325 MG tablet Take 1 tablet (325 mg total) by mouth daily. Patient not taking: Reported on 05/27/2016 10/25/15 10/24/16  Emily Filbert, MD  HYDROcodone-acetaminophen (  NORCO/VICODIN) 5-325 MG tablet Take 1 tablet by mouth every 4 (four) hours as needed for moderate pain. Patient not taking: Reported on 05/27/2016 05/24/16 05/24/17  Chinita Pester, FNP  sulfamethoxazole-trimethoprim (BACTRIM DS,SEPTRA DS) 800-160 MG tablet Take 1 tablet by mouth 2 (two) times daily. Patient not taking: Reported on 05/27/2016 05/24/16   Chinita Pester, FNP      VITAL SIGNS:  Blood pressure (!) 143/87,  pulse (!) 112, temperature 99.1 F (37.3 C), temperature source Oral, resp. rate 16, height  (1.778 m), weight 107 kg (236 lb), SpO2 96 %.  PHYSICAL EXAMINATION:  Physical Exam  GENERAL:  51 y.o.-year-old obese patient lying in the bed with no acute distress.  EYES: Pupils equal, round, reactive to light and accommodation. No scleral icterus. Extraocular muscles intact.  HEENT: Head atraumatic, normocephalic. Oropharynx and nasopharynx clear. No oropharyngeal erythema, moist oral mucosa  NECK:  Supple, no jugular venous distention. No thyroid enlargement, no tenderness.  LUNGS: Normal breath sounds bilaterally, no wheezing, rales, rhonchi. No use of accessory muscles of respiration.  CARDIOVASCULAR: S1, S2 RRR. No murmurs, rubs, gallops, clicks.  ABDOMEN: Soft, nontender, nondistended. Bowel sounds present. No organomegaly or mass.  EXTREMITIES: No pedal edema, cyanosis, or clubbing. + 2 pedal & radial pulses b/l.   NEUROLOGIC: Cranial nerves II through XII are intact. No focal Motor or sensory deficits appreciated b/l PSYCHIATRIC: The patient is alert and oriented x 3.  SKIN: No obvious rash, lesion, or ulcer.  GU: Right sided scrotal wall induration and tenderness, small open area with some pus drainage which is foul smelling.  LABORATORY PANEL:   CBC  Recent Labs Lab 05/27/16 1638  WBC 15.2*  HGB 14.2  HCT 41.0  PLT 329   ------------------------------------------------------------------------------------------------------------------  Chemistries   Recent Labs Lab 05/27/16 1638  NA 131*  K 3.2*  CL 93*  CO2 28  GLUCOSE 279*  BUN 20  CREATININE 1.19  CALCIUM 9.2  AST 22  ALT 22  ALKPHOS 78  BILITOT 0.5   ------------------------------------------------------------------------------------------------------------------  Cardiac Enzymes No results for input(s): TROPONINI in the last 168  hours. ------------------------------------------------------------------------------------------------------------------  RADIOLOGY:  No results found.   IMPRESSION AND PLAN:   51 year old male with past medical history of diabetes, hypertension who presents to the hospital due to right scrotal pain and drainage.  1. Sepsis-patient meets criteria given his tachycardia, leukocytosis and a right-sided scrotal wall which is tender and draining consistent with a cellulitis/abscess. -I will treat the patient with IV vancomycin, Zosyn. Follow bone, blood cultures. [facility] consult urology.  2. Right sided scrotal wall abscess/cellulitis-broad-spectrum IV antibiotics with vancomycin, Zosyn. -Urology consult for possible need for localized incision and drainage.  3. Leukocytosis-secondary to the cellulitis/abscess-follow with IV antibiotic therapy.  4. Diabetes type 2 without complication-hold metformin, Place on sliding scale insulin.  5. Hypokalemia-we'll place on oral potassium supplements, repeat level in the morning. Check magnesium level.  6. Essential hypertension-continue Norvasc, metoprolol.    All the records are reviewed and case discussed with ED provider. Management plans discussed with the patient, family and they are in agreement.  CODE STATUS: Full Code  TOTAL TIME TAKING CARE OF THIS PATIENT: 45 minutes.    Houston Siren M.D on 05/27/2016 at 7:59 PM  Between 7am to 6pm - Pager - [phone]  After 6pm go to [email or web address] - password [email or web address] [facility]  [facility]  Office  [phone]  CC: Primary care physician; [facility]

## 2016-05-27 NOTE — ED Notes (Signed)
This RN consulted Dr. Anne Hahn about pt wanting to leave. Dr. Anne Hahn does not think pt leaving is a good idea. This RN talked to pt about needing to stay for admission, pt states that he wants to go home and call doctor in the AM about an antibiotic that he took in 2012 that made his kidney function worse. Pt states he doesn't know what antibiotic is so he doesn't "want to be pumped full of antibiotics tonight and have his kidneys bad again." pt informed that leaving would be a bad idea because of infection and problems associated with abscess and I&D. Pt still wanting to go home. Pt informed that he will more than likely have to come back to ED and pt still does not want to stay. Dr. Anne Hahn and Dr. Derrill Kay informed.

## 2016-05-27 NOTE — Progress Notes (Signed)
Pharmacy Antibiotic Note  Fernando Evans is a 51 y.o. male admitted on 05/27/2016 with Scrotal wall Abscess. Pharmacy has been consulted for vancomycin and zosyn dosing. Patient received vancomycin 1gm IV x 1 dose in ED.   Plan: Ke: 0.081    t1/2:  8.6   Vd: 63  Will start patient on Vancomycin  IV every 12 hours with 6 hour stack dosing. Calculated trough at Css is 16. Trough level ordered prior to 4th dose. Will monitor renal function and adjust dose as needed.   Will order Zosyn 3.375 IV EI every 8 hours.   Height:  (177.8 cm) Weight: 236 lb (107 kg) IBW/kg (Calculated) : 73  Temp (24hrs), Avg:99.1 F (37.3 C), Min:99.1 F (37.3 C), Max:99.1 F (37.3 C)   Recent Labs Lab 05/27/16 1638 05/27/16 1836  WBC 15.2*  --   CREATININE 1.19  --   LATICACIDVEN 1.6 1.8    Estimated Creatinine Clearance: 92 mL/min (by C-G formula based on SCr of 1.19 mg/dL).    No Known Allergies  Antimicrobials this admission: 4/10 Vancomycin  >>  4/10 Zosyn >>   Dose adjustments this admission:   Microbiology results: 4/10  Wound Cx:   Thank you for allowing pharmacy to be a part of this patient's care.  Gardner Candle, PharmD, BCPS Clinical Pharmacist 05/27/2016 8:11 PM

## 2016-05-27 NOTE — ED Triage Notes (Signed)
Patient presents to the ED 2 days post having an abscess lanced in the ED.  Patient states he has been taking bactrim and saw his PCP yesterday who told patient to come to the ED because area still seemed red and hard and painful.  Patient states, "I don't think they got the pouch of it.  That's happened to me before."  Patient is in no obvious distress at this time.

## 2016-05-27 NOTE — ED Notes (Signed)
Pt came out to desk saying "you still ain't talked to nobody yet?" Pt informed all this RN is waiting on is DC/AMA papers.

## 2016-05-27 NOTE — ED Notes (Signed)
Pt wife came out stating that pt wants to go home since he had the I&D. Informed that the doctor will be informed of pt's wishes.

## 2016-05-28 ENCOUNTER — Telehealth: Payer: Self-pay | Admitting: Urology

## 2016-05-28 NOTE — Telephone Encounter (Signed)
appt has been made and patient has been notified of it and the new office location.  Fernando Evans

## 2016-05-28 NOTE — Telephone Encounter (Signed)
-----   Message from Lissa Hoard, CMA sent at 05/28/2016  2:08 PM EDT ----- Regarding: FW: FU at [facility]   ----- Message ----- From: Sebastian Ache, MD Sent: 05/27/2016   9:35 PM To: Lissa Hoard, CMA Subject: FU at [facility]                                      This guy needs FU any provider at [facility] in about 7-10 days for scrotal drain removal (yes, another one). Had ER I+D on 4/10.  Thanks, T [employer]

## 2016-05-30 LAB — AEROBIC CULTURE  (SUPERFICIAL SPECIMEN)

## 2016-05-30 LAB — AEROBIC CULTURE W GRAM STAIN (SUPERFICIAL SPECIMEN): Culture: NORMAL

## 2016-06-03 ENCOUNTER — Encounter: Payer: Self-pay | Admitting: Urology

## 2016-06-03 ENCOUNTER — Ambulatory Visit (INDEPENDENT_AMBULATORY_CARE_PROVIDER_SITE_OTHER): Payer: BLUE CROSS/BLUE SHIELD | Admitting: Urology

## 2016-06-03 VITALS — BP 147/92 | HR 109 | Ht 70.0 in | Wt 232.5 lb

## 2016-06-03 DIAGNOSIS — N492 Inflammatory disorders of scrotum: Secondary | ICD-10-CM

## 2016-06-03 NOTE — Progress Notes (Signed)
06/03/2016 8:59 AM   Fernando Evans [birth year] [id number]  Referring provider: [facility] [address] [place], [place] [address]  Chief Complaint  Patient presents with  . Groin Swelling    Follow up    HPI: 51 yo s/p right scrotal I&D 05/27/2016 here for drain removal. He has no complaints. He had a similar episode in 2012. He completes antibiotics today.    PMH: Past Medical History:  Diagnosis Date  . Diabetes mellitus without complication (HCC)   . Hypertension     Surgical History: History reviewed. No pertinent surgical history.  Home Medications:  Allergies as of 06/03/2016   No Known Allergies     Medication List       Accurate as of 06/03/16  8:59 AM. Always use your most recent med list.          amLODipine 10 MG tablet Commonly known as:  NORVASC Take 1 tablet (10 mg total) by mouth daily.   aspirin EC 81 MG tablet Take 81 mg by mouth daily.   aspirin EC 325 MG tablet Take 1 tablet (325 mg total) by mouth daily.   HYDROcodone-acetaminophen 5-325 MG tablet Commonly known as:  NORCO/VICODIN Take 1 tablet by mouth every 4 (four) hours as needed for moderate pain.   metFORMIN 1000 MG tablet Commonly known as:  GLUCOPHAGE Take 1,000 mg by mouth 2 (two) times daily with a meal.   metoprolol tartrate 25 MG tablet Commonly known as:  LOPRESSOR Take 25 mg by mouth 2 (two) times daily.   sulfamethoxazole-trimethoprim 800-160 MG tablet Commonly known as:  BACTRIM DS,SEPTRA DS Take 1 tablet by mouth 2 (two) times daily.       Allergies: No Known Allergies  Family History: Family History  Problem Relation Age of Onset  . Sarcoidosis Mother   . Kidney disease Father   . Prostate cancer Neg Hx   . Bladder Cancer Neg Hx   . Kidney cancer Neg Hx     Social History:  reports that he has been smoking Cigarettes.  He has a 10.00 pack-year smoking history. He has never used smokeless tobacco. He reports that he drinks  alcohol. He reports that he does not use drugs.  ROS:                                        Physical Exam: BP (!) 147/92 (BP Location: Left Arm, Patient Position: Sitting, Cuff Size: Large)   Pulse (!) 109   Ht  (1.778 m)   Wt 105.5 kg (232 lb 8 oz)   BMI 33.36 kg/m   Constitutional:  Alert and oriented, No acute distress. HEENT: [place] AT, moist mucus membranes.  Trachea midline, no masses. Cardiovascular: No clubbing, cyanosis, or edema. Respiratory: Normal respiratory effort, no increased work of breathing. GI: Abdomen is soft, nontender, nondistended, no abdominal masses GU: No CVA tenderness. Scrotum - right wound drain sutures cut and drain removed intact. Healthy red granulation underneath. Skin healthy. No areas of fluctuance or induration remain.  Skin: No rashes, bruises or suspicious lesions. Lymph: No cervical or inguinal adenopathy. Neurologic: Grossly intact, no focal deficits, moving all 4 extremities. Psychiatric: Normal mood and affect.  Laboratory Data: Lab Results  Component Value Date   WBC 15.2 (H) 05/27/2016   HGB 14.2 05/27/2016   HCT 41.0 05/27/2016   MCV 87.2 05/27/2016  PLT 329 05/27/2016    Lab Results  Component Value Date   CREATININE 1.19 05/27/2016    No results found for: PSA  No results found for: TESTOSTERONE  No results found for: HGBA1C  Urinalysis    Component Value Date/Time   COLORURINE YELLOW (A) 05/27/2016 1639   APPEARANCEUR CLEAR (A) 05/27/2016 1639   LABSPEC 1.022 05/27/2016 1639   PHURINE 5.0 05/27/2016 1639   GLUCOSEU >=500 (A) 05/27/2016 1639   HGBUR NEGATIVE 05/27/2016 1639   BILIRUBINUR NEGATIVE 05/27/2016 1639   KETONESUR 5 (A) 05/27/2016 1639   PROTEINUR NEGATIVE 05/27/2016 1639   NITRITE NEGATIVE 05/27/2016 1639   LEUKOCYTESUR NEGATIVE 05/27/2016 1639     Assessment & Plan:    Right scrotal abscess --  Healing well. Resume normal activity. Discussed wound care.   There are  no diagnoses linked to this encounter.  No Follow-up on file.  Jerilee Field, MD  [facility] [address] [place], [place] [address] [phone]

## 2016-06-18 NOTE — ED Provider Notes (Signed)
[facility] Emergency Department Provider Note   ____________________________________________   I have reviewed the triage vital signs and the nursing notes.   HISTORY  Chief Complaint Wound Check   History limited by: Not Limited   HPI Fernando Evans is a 51 y.o. male who presents to the emergency department today because of concern for scrotal swelling, pain and possible continued infection. The patient was seen in the emergency department three days previously for similar complaints and had an I and D performed for scrotal abscess. It has been present for roughly one week. The patient was placed on bactrim after the I and D. The patient states that since than he feels like it is getting worse. He has had increasing pain. He has also noticed some drainage.    Past Medical History:  Diagnosis Date  . Diabetes mellitus without complication (HCC)   . Hypertension     Patient Active Problem List   Diagnosis Date Noted  . Scrotal wall abscess 05/27/2016    History reviewed. No pertinent surgical history.  Prior to Admission medications   Medication Sig Start Date End Date Taking? Authorizing Provider  amLODipine (NORVASC) 10 MG tablet Take 1 tablet (10 mg total) by mouth daily. 10/25/15 10/24/16 Yes Emily Filbert, MD  aspirin EC 81 MG tablet Take 81 mg by mouth daily.   Yes Historical Provider, MD  metFORMIN (GLUCOPHAGE) 1000 MG tablet Take 1,000 mg by mouth 2 (two) times daily with a meal.   Yes Historical Provider, MD  metoprolol tartrate (LOPRESSOR) 25 MG tablet Take 25 mg by mouth 2 (two) times daily.   Yes Historical Provider, MD  aspirin EC 325 MG tablet Take 1 tablet (325 mg total) by mouth daily. Patient not taking: Reported on 06/03/2016 10/25/15 10/24/16  Emily Filbert, MD  HYDROcodone-acetaminophen (NORCO/VICODIN) 5-325 MG tablet Take 1 tablet by mouth every 4 (four) hours as needed for moderate pain. Patient not taking: Reported on  05/27/2016 05/24/16 05/24/17  Chinita Pester, FNP  sulfamethoxazole-trimethoprim (BACTRIM DS,SEPTRA DS) 800-160 MG tablet Take 1 tablet by mouth 2 (two) times daily. 05/24/16   Chinita Pester, FNP    Allergies Patient has no known allergies.  Family History  Problem Relation Age of Onset  . Sarcoidosis Mother   . Kidney disease Father   . Prostate cancer Neg Hx   . Bladder Cancer Neg Hx   . Kidney cancer Neg Hx     Social History Social History  Substance Use Topics  . Smoking status: Current Every Day Smoker    Packs/day: 0.50    Years: 20.00    Types: Cigarettes  . Smokeless tobacco: Never Used  . Alcohol use Yes     Comment: socially    Review of Systems Constitutional: Positive for fevers.  Eyes: No visual changes. ENT: No sore throat. Cardiovascular: Denies chest pain. Respiratory: Denies shortness of breath. Gastrointestinal: No abdominal pain.  No nausea, no vomiting.  No diarrhea.   Genitourinary: Positive for scrotal swelling and pain.  Musculoskeletal: Negative for back pain. Skin: Positive for redness, swelling to scrotal skin. Neurological: Negative for headaches, focal weakness or numbness.  ____________________________________________   PHYSICAL EXAM:  VITAL SIGNS: ED Triage Vitals  Enc Vitals Group     BP 05/27/16 1635 (!) 143/87     Pulse Rate 05/27/16 1635 (!) 112     Resp 05/27/16 1635 16     Temp 05/27/16 1635 99.1 F (37.3 C)  Temp Source 05/27/16 1635 Oral     SpO2 05/27/16 1635 96 %     Weight 05/27/16 1636 236 lb (107 kg)     Height 05/27/16 1636  (1.778 m)     Head Circumference --      Peak Flow --      Pain Score 05/27/16 1635 8   Constitutional: Alert and oriented. Well appearing and in no distress. Eyes: Conjunctivae are normal. Normal extraocular movements. ENT   Head: Normocephalic and atraumatic.   Nose: No congestion/rhinnorhea.   Mouth/Throat: Mucous membranes are moist.   Neck: No  stridor. Hematological/Lymphatic/Immunilogical: No cervical lymphadenopathy. Cardiovascular: Normal rate, regular rhythm.  No murmurs, rubs, or gallops.  Respiratory: Normal respiratory effort without tachypnea nor retractions. Breath sounds are clear and equal bilaterally. No wheezes/rales/rhonchi. Gastrointestinal: Soft and non tender. No rebound. No guarding.  Genitourinary: Redness, swelling to right scrotum. Some drainage noted consisting of pus like material.  Musculoskeletal: Normal range of motion in all extremities. No lower extremity edema. Neurologic:  Normal speech and language. No gross focal neurologic deficits are appreciated.  Skin:  Skin is warm, dry and intact. No rash noted. Psychiatric: Mood and affect are normal. Speech and behavior are normal. Patient exhibits appropriate insight and judgment.  ____________________________________________    LABS (pertinent positives/negatives)  WBC 15.2 Na 131 K 3.2 Glucose 279  ____________________________________________   EKG  None  ____________________________________________    RADIOLOGY  US scrotum IMPRESSION:  1. Focal complex area at the right lateral/posterior aspect of the  scrotum in region of recently drained abscess. Finding may reflect  post interventional changes and/or infection. Scattered echogenic  foci within this region likely reflect air, suspected to be related  to recent procedure. No discrete or drainable collection identified  on today's exam.  2. Bilateral varicoceles.  3. Normal sonographic appearance of the testes.       ____________________________________________   PROCEDURES  Procedures  ____________________________________________   INITIAL IMPRESSION / ASSESSMENT AND PLAN / ED COURSE  Pertinent labs & imaging results that were available during my care of the patient were reviewed by me and considered in my medical decision making (see chart for details).  Patient  presented to the emergency department today because of concerns for continued right scrotal swelling and potential abscess. On exam he does have some swelling and fluctuance noted to the right scrotum. In addition patient was tachycardic with elevated white blood cell count. All this is concerning for sepsis in the setting of an abscess. Will plan on admission to the hospital service.  Hospitalists and urology both evaluated the patient. Urology did perform bedside I&D. Patient had this point requested to be discharged. I discussed with the patient my concerns. Discussed risk of worsening infection or testicular loss. Patient verbalized understanding of the risks.  ____________________________________________   FINAL CLINICAL IMPRESSION(S) / ED DIAGNOSES  Final diagnoses:  Abscess     Note: This dictation was prepared with Dragon dictation. Any transcriptional errors that result from this process are unintentional     Phineas Semen, MD 06/18/16 1407

## 2016-06-19 ENCOUNTER — Ambulatory Visit: Payer: BLUE CROSS/BLUE SHIELD | Admitting: Urology

## 2016-11-21 ENCOUNTER — Emergency Department: Payer: Self-pay

## 2016-11-21 ENCOUNTER — Emergency Department
Admission: EM | Admit: 2016-11-21 | Discharge: 2016-11-21 | Disposition: A | Discharge status: Home / Self Care | Payer: Self-pay | Attending: Emergency Medicine | Admitting: Emergency Medicine

## 2016-11-21 ENCOUNTER — Encounter: Payer: Self-pay | Admitting: *Deleted

## 2016-11-21 DIAGNOSIS — M170 Bilateral primary osteoarthritis of knee: Secondary | ICD-10-CM | POA: Insufficient documentation

## 2016-11-21 DIAGNOSIS — Z7984 Long term (current) use of oral hypoglycemic drugs: Secondary | ICD-10-CM | POA: Insufficient documentation

## 2016-11-21 DIAGNOSIS — Z79899 Other long term (current) drug therapy: Secondary | ICD-10-CM | POA: Insufficient documentation

## 2016-11-21 DIAGNOSIS — E119 Type 2 diabetes mellitus without complications: Secondary | ICD-10-CM | POA: Insufficient documentation

## 2016-11-21 DIAGNOSIS — I10 Essential (primary) hypertension: Secondary | ICD-10-CM | POA: Insufficient documentation

## 2016-11-21 DIAGNOSIS — Z7982 Long term (current) use of aspirin: Secondary | ICD-10-CM | POA: Insufficient documentation

## 2016-11-21 DIAGNOSIS — F17210 Nicotine dependence, cigarettes, uncomplicated: Secondary | ICD-10-CM | POA: Insufficient documentation

## 2016-11-21 MED ORDER — MELOXICAM 15 MG PO TABS: 15.0000 mg | ORAL_TABLET | Freq: Every day | ORAL | 0 refills | Status: AC

## 2016-11-21 MED ORDER — TRAMADOL HCL 50 MG PO TABS: 50.0000 mg | ORAL_TABLET | Freq: Four times a day (QID) | ORAL | 0 refills | Status: AC | PRN

## 2016-11-21 NOTE — ED Triage Notes (Signed)
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

## 2016-11-21 NOTE — ED Notes (Signed)
See triage note  Presents with pain to left knee  States he had initial injury in the mid 80's from football  Has had intermittent pain since  Increased pain over the past month  Ambulates with slight limp d/t pain

## 2016-11-21 NOTE — ED Provider Notes (Signed)
[facility] Emergency Department Provider Note   ____________________________________________   None    (approximate)  I have reviewed the triage vital signs and the nursing notes.   HISTORY  Chief Complaint Knee Pain    HPI Fernando Evans is a 51 y.o. male patient complaining increasing left knee pain for 2 weeks. Patient stated only provocative incident as he changed jobs and stands on concrete for long period time. Patient stated right knee when he has pain secondary to compensation mechanism. Patient rates pain as a 10 over 10. Patient described a pain as "achy". Patient stated mild transient relief with ibuprofen.  Past Medical History:  Diagnosis Date  . Diabetes mellitus without complication (HCC)   . Hypertension     Patient Active Problem List   Diagnosis Date Noted  . Scrotal wall abscess 05/27/2016    History reviewed. No pertinent surgical history.  Prior to Admission medications   Medication Sig Start Date End Date Taking? Authorizing Provider  lisinopril (PRINIVIL,ZESTRIL) 20 MG tablet Take 20 mg by mouth daily.   Yes [provider]  amLODipine (NORVASC) 10 MG tablet Take 1 tablet (10 mg total) by mouth daily. 10/25/15 10/24/16  Emily Filbert, MD  aspirin EC 81 MG tablet Take 81 mg by mouth daily.    [provider]  HYDROcodone-acetaminophen (NORCO/VICODIN) 5-325 MG tablet Take 1 tablet by mouth every 4 (four) hours as needed for moderate pain. Patient not taking: Reported on 05/27/2016 05/24/16 05/24/17  Kem Boroughs B, FNP  meloxicam (MOBIC) 15 MG tablet Take 1 tablet (15 mg total) by mouth daily. 11/21/16   Joni Reining, PA-C  metFORMIN (GLUCOPHAGE) 1000 MG tablet Take 1,000 mg by mouth 2 (two) times daily with a meal.    [provider]  metoprolol tartrate (LOPRESSOR) 25 MG tablet Take 25 mg by mouth 2 (two) times daily.    [provider]  sulfamethoxazole-trimethoprim (BACTRIM DS,SEPTRA  DS) 800-160 MG tablet Take 1 tablet by mouth 2 (two) times daily. 05/24/16   Triplett, Rulon Eisenmenger B, FNP  traMADol (ULTRAM) 50 MG tablet Take 1 tablet (50 mg total) by mouth every 6 (six) hours as needed for moderate pain. 11/21/16   Joni Reining, PA-C    Allergies Patient has no known allergies.  Family History  Problem Relation Age of Onset  . Sarcoidosis Mother   . Kidney disease Father   . Prostate cancer Neg Hx   . Bladder Cancer Neg Hx   . Kidney cancer Neg Hx     Social History Social History  Substance Use Topics  . Smoking status: Current Every Day Smoker    Packs/day: 0.50    Years: 20.00    Types: Cigarettes  . Smokeless tobacco: Never Used  . Alcohol use Yes     Comment: socially    Review of Systems  Constitutional: No fever/chills Eyes: No visual changes. ENT: No sore throat. Cardiovascular: Denies chest pain. Respiratory: Denies shortness of breath. Gastrointestinal: No abdominal pain.  No nausea, no vomiting.  No diarrhea.  No constipation. Genitourinary: Negative for dysuria. Musculoskeletal:Left knee pain  Skin: Negative for rash. Neurological: Negative for headaches, focal weakness or numbness. Endocrine:Hypertension and diabetes ____________________________________________   PHYSICAL EXAM:  VITAL SIGNS: ED Triage Vitals  Enc Vitals Group     BP 11/21/16 0821 (!) 173/99     Pulse Rate 11/21/16 0821 (!) 103     Resp 11/21/16 0821 20     Temp 11/21/16  0821 98.4 F (36.9 C)     Temp Source 11/21/16 0821 Oral     SpO2 11/21/16 0821 100 %     Weight 11/21/16 0821 250 lb (113.4 kg)     Height 11/21/16 0821  (1.778 m)     Head Circumference --      Peak Flow --      Pain Score 11/21/16 0820 10     Pain Loc --      Pain Edu? --      Excl. in GC? --     Constitutional: Alert and oriented. Well appearing and in no acute distress. Cardiovascular: Normal rate, regular rhythm. Grossly normal heart sounds.  Good peripheral circulation. Elevated  blood pressure Respiratory: Normal respiratory effort.  No retractions. Lungs CTAB. Gastrointestinal: Soft and nontender. No distention. No abdominal bruits. No CVA tenderness. Musculoskeletal: No obvious deformity to the left knee. No obvious edema or erythema. Nontender to palpation. Patient has full and equal range of motion.  Neurologic:  Normal speech and language. No gross focal neurologic deficits are appreciated. No gait instability. Skin:  Skin is warm, dry and intact. No rash noted. Psychiatric: Mood and affect are normal. Speech and behavior are normal.  ____________________________________________   LABS (all labs ordered are listed, but only abnormal results are displayed)  Labs Reviewed - No data to display ____________________________________________  EKG   ____________________________________________  RADIOLOGY  Dg Knee Complete 4 Views Left  Result Date: 11/21/2016 CLINICAL DATA:  Knee pain, no known injury, initial encounter EXAM: LEFT KNEE - COMPLETE 4+ VIEW COMPARISON:  None. FINDINGS: Degenerative changes are noted particularly in the medial joint space. No joint effusion is seen. No acute fracture or dislocation is noted. No soft tissue abnormality is seen. IMPRESSION: Mild degenerative change without acute abnormality. Electronically Signed   By: Alcide Clever M.D.   On: 11/21/2016 08:53    X-ray findings consistent with osteoarthritis of the left knee. ___________________________________________   PROCEDURES  Procedure(s) performed: None  Procedures  Critical Care performed: No  ____________________________________________   INITIAL IMPRESSION / ASSESSMENT AND PLAN / ED COURSE  @  Left knee pain secondary to osteoarthritis. Discussed x-ray finding with patient. Patient given discharge care instructions. Patient advised take medications as directed and follow-up with the [facility]. Patient given a work  note.      ____________________________________________   FINAL CLINICAL IMPRESSION(S) / ED DIAGNOSES  Final diagnoses:  Osteoarthritis of both knees, unspecified osteoarthritis type      NEW MEDICATIONS STARTED DURING THIS VISIT:  New Prescriptions   MELOXICAM (MOBIC) 15 MG TABLET    Take 1 tablet (15 mg total) by mouth daily.   TRAMADOL (ULTRAM) 50 MG TABLET    Take 1 tablet (50 mg total) by mouth every 6 (six) hours as needed for moderate pain.     Note:  This document was prepared using Dragon voice recognition software and may include unintentional dictation errors.    Joni Reining, PA-C 11/21/16 4540    Jene Every, MD 11/21/16 [id number]

## 2016-11-21 NOTE — ED Triage Notes (Signed)
C/O left knee pain x 2 months.  AAOx3.  Skin warm and dry.  NAD.  Ambulates with easy and steady gait.

## 2017-08-17 IMAGING — CT CT HEAD W/O CM
3 series · 16 of 47 positions shown, 19 images · non-contrast
Comparison: None.

CLINICAL DATA: Dizziness.  Headache.

EXAM:
CT HEAD WITHOUT CONTRAST
TECHNIQUE: Contiguous axial images were obtained from the base of the skull
through the vertex without intravenous contrast.

[Series 2: head wo · axial · 0.43mm/px · z∈[-78,+47]mm · 10 of 30 slices shown, 13 images]
[im 3/30  brain]
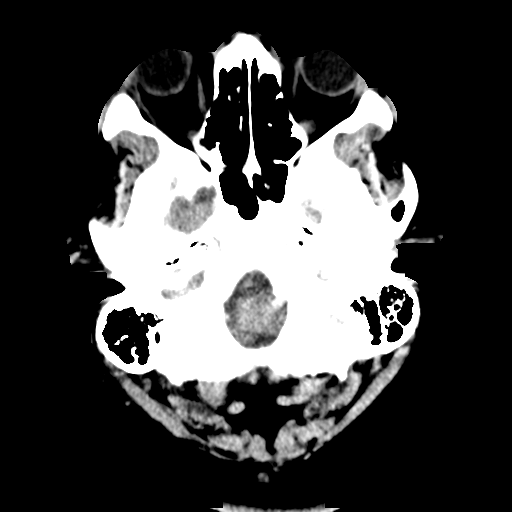
[im 3/30  bone]
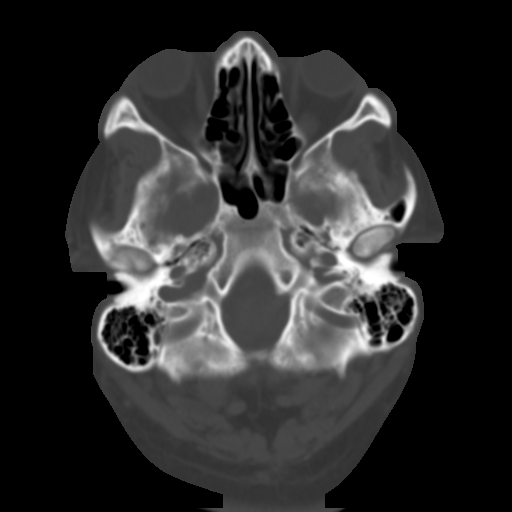
[im 6/30  brain]
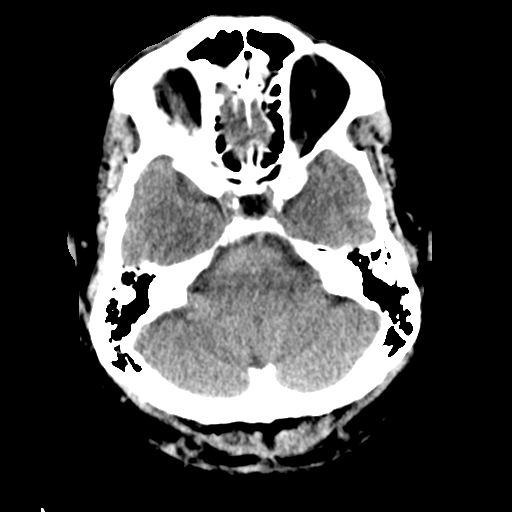
[im 9/30  brain]
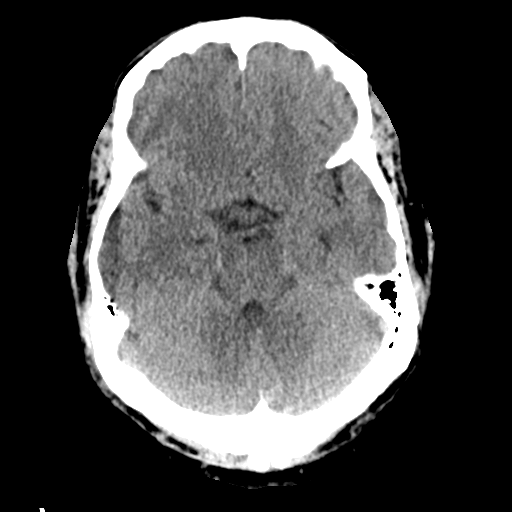
[im 11/30  brain]
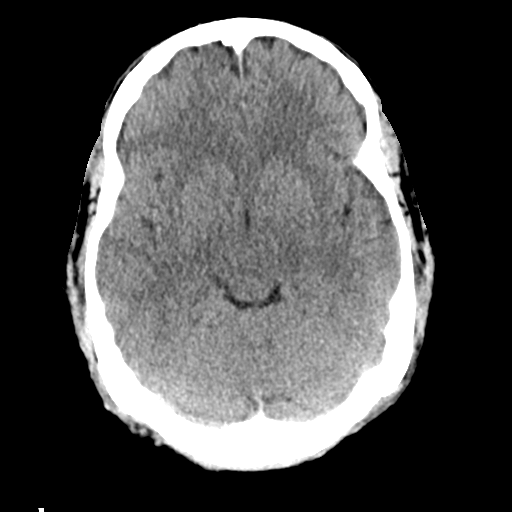
[im 14/30  brain]
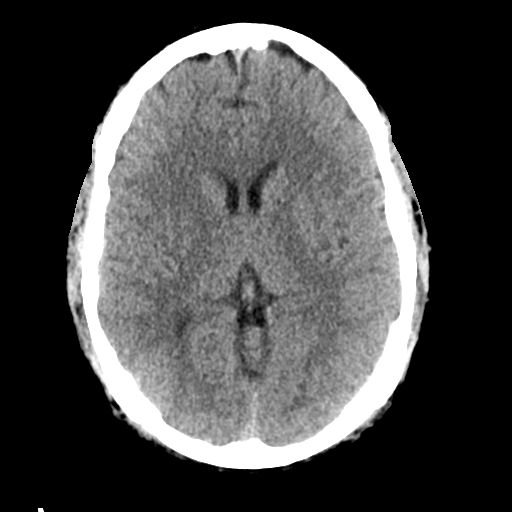
[im 14/30  bone]
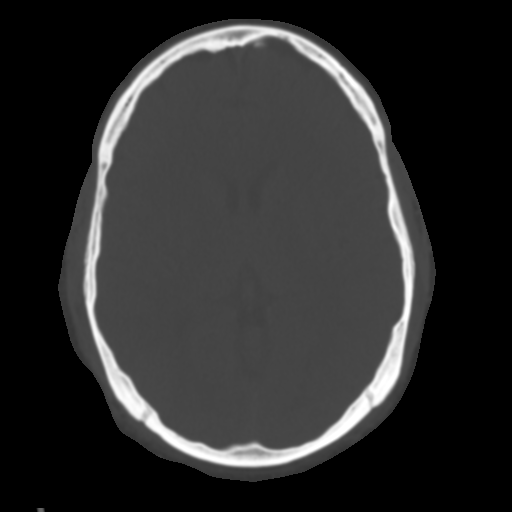
[im 17/30  brain]
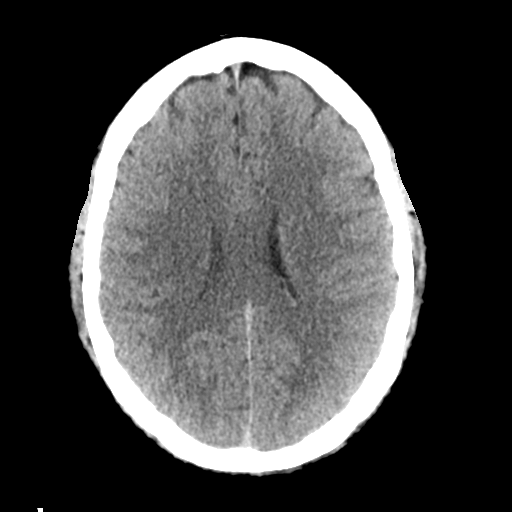
[im 20/30  brain]
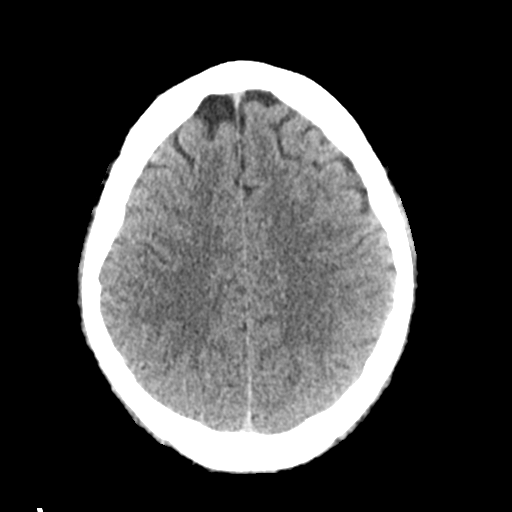
[im 23/30  brain]
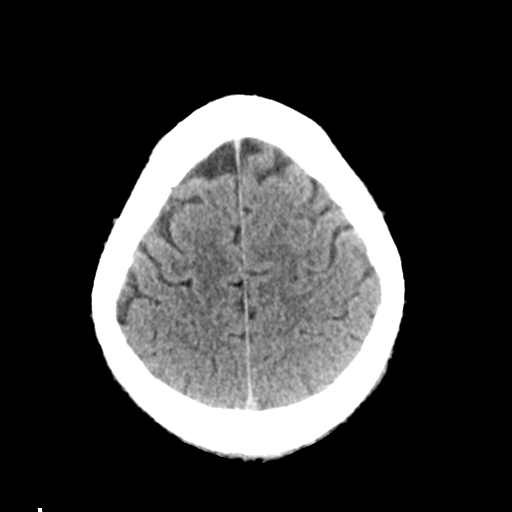
[im 25/30  brain]
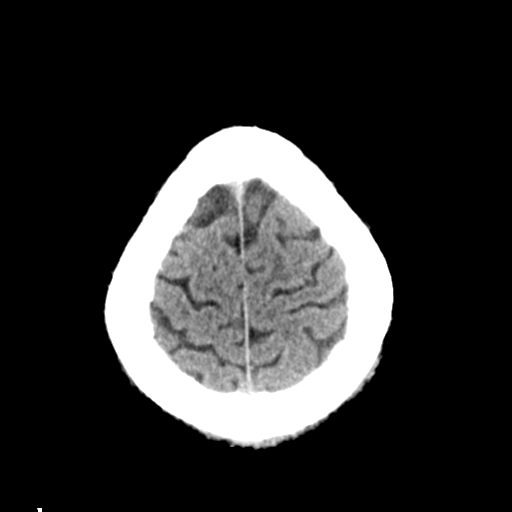
[im 25/30  bone]
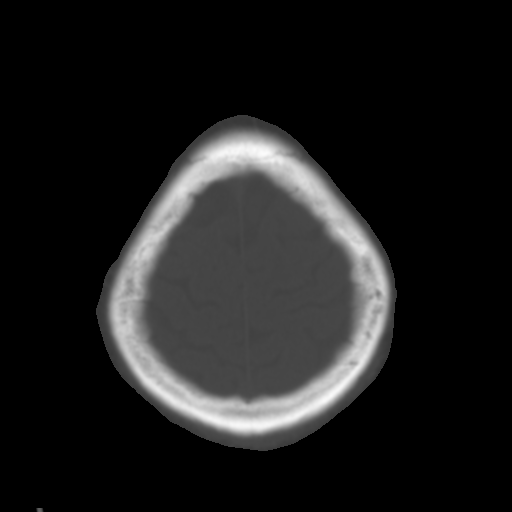
[im 28/30  brain]
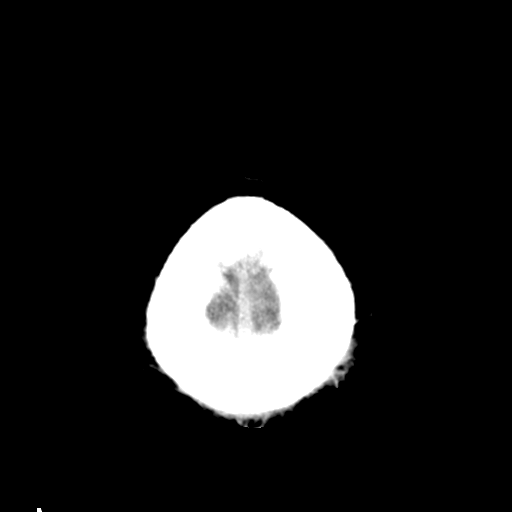

[Series 4: coronal soft tissue · coronal · 0.31mm/px · 3 of 69 slices shown]
[im 23/69  brain]
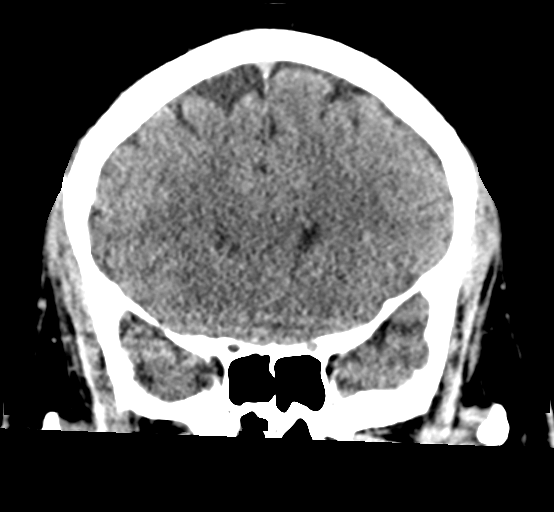
[im 31/69  brain]
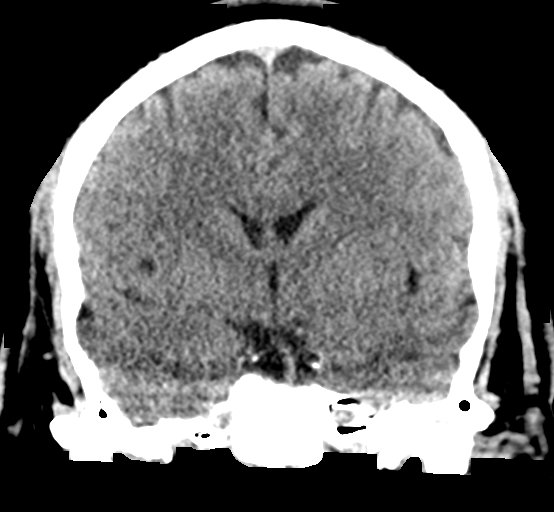
[im 38/69  brain]
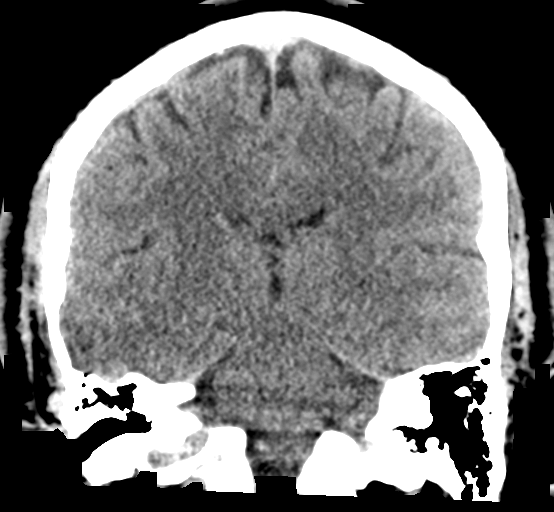

[Series 5: sagittal soft tissue · sagittal · 0.31mm/px · 3 of 57 slices shown]
[im 19/57  brain]
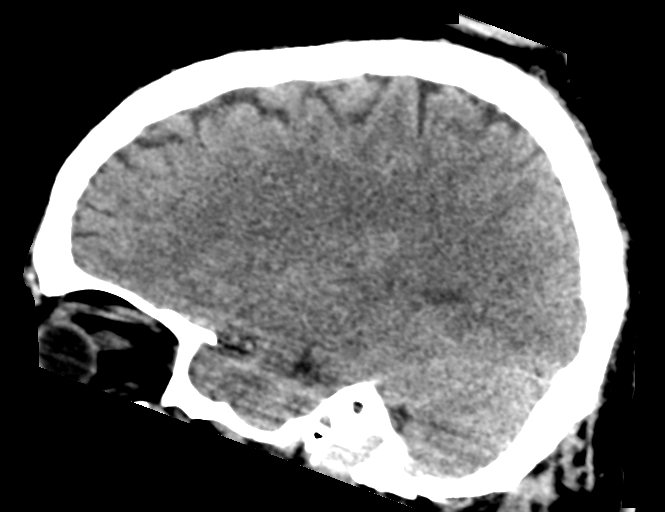
[im 29/57  brain]
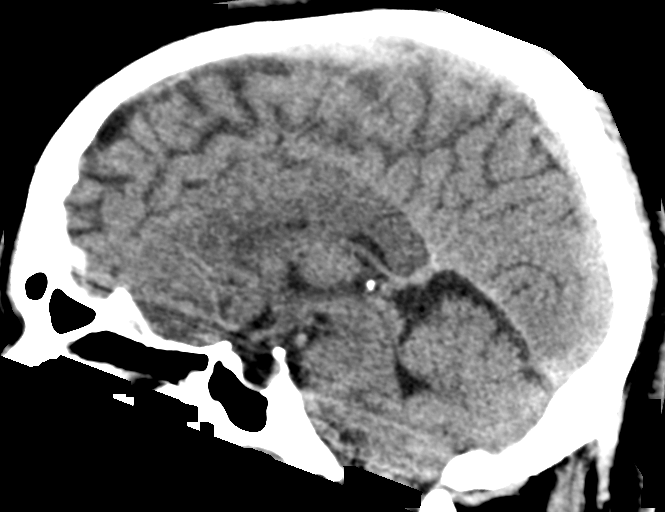
[im 38/57  brain]
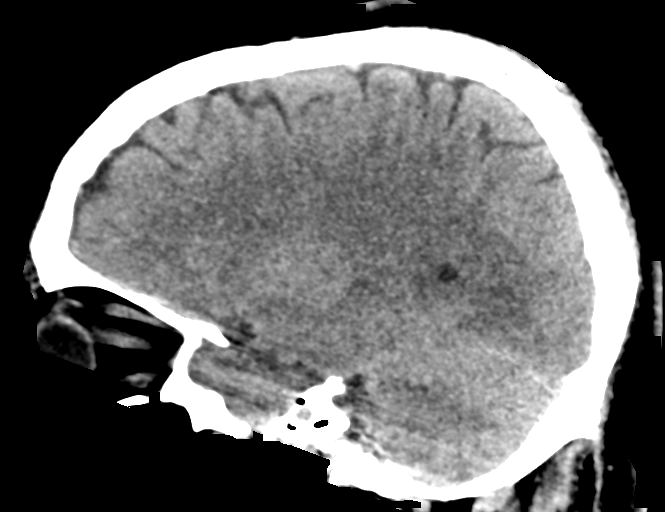

[16 of 47 positions shown; findings below may reference images not displayed]

FINDINGS: Bony calvarium appears intact. No mass effect or midline shift is
noted. Ventricular size is within normal limits. There is no
evidence of mass lesion, hemorrhage or acute infarction.
IMPRESSION: Normal head CT.

## 2018-09-14 IMAGING — CR DG KNEE COMPLETE 4+V*L*
1 series · 4 of 4 positions shown · non-contrast
Comparison: None.

CLINICAL DATA: Knee pain, no known injury, initial encounter

EXAM:
LEFT KNEE - COMPLETE 4+ VIEW

[Series 1: t knee ap left · 0.14mm/px · 4 of 4 slices shown]
[im 1/4]
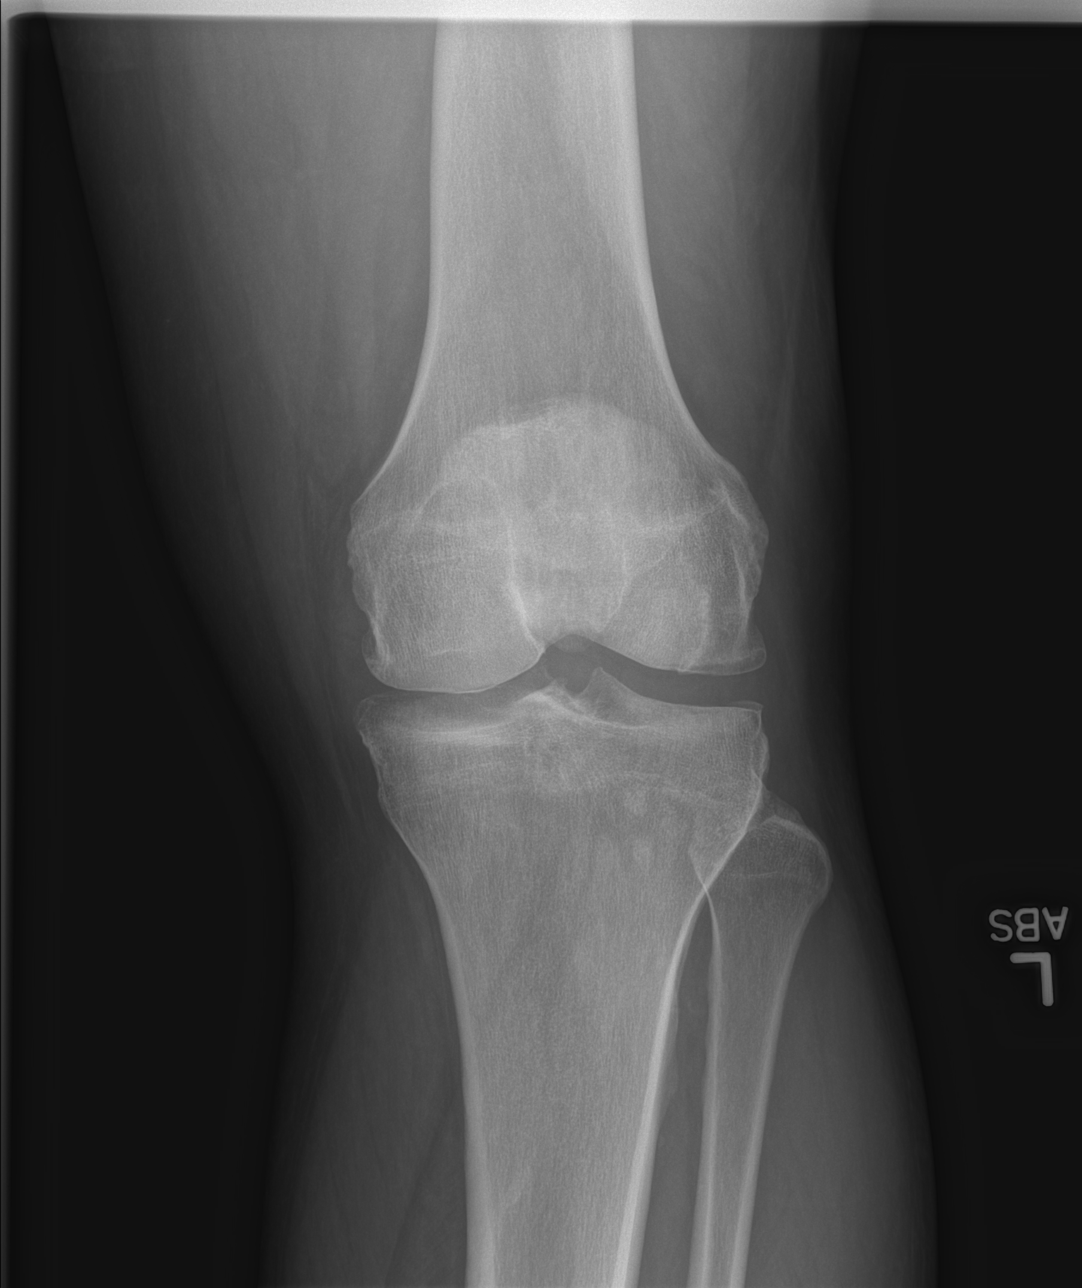
[im 2/4]
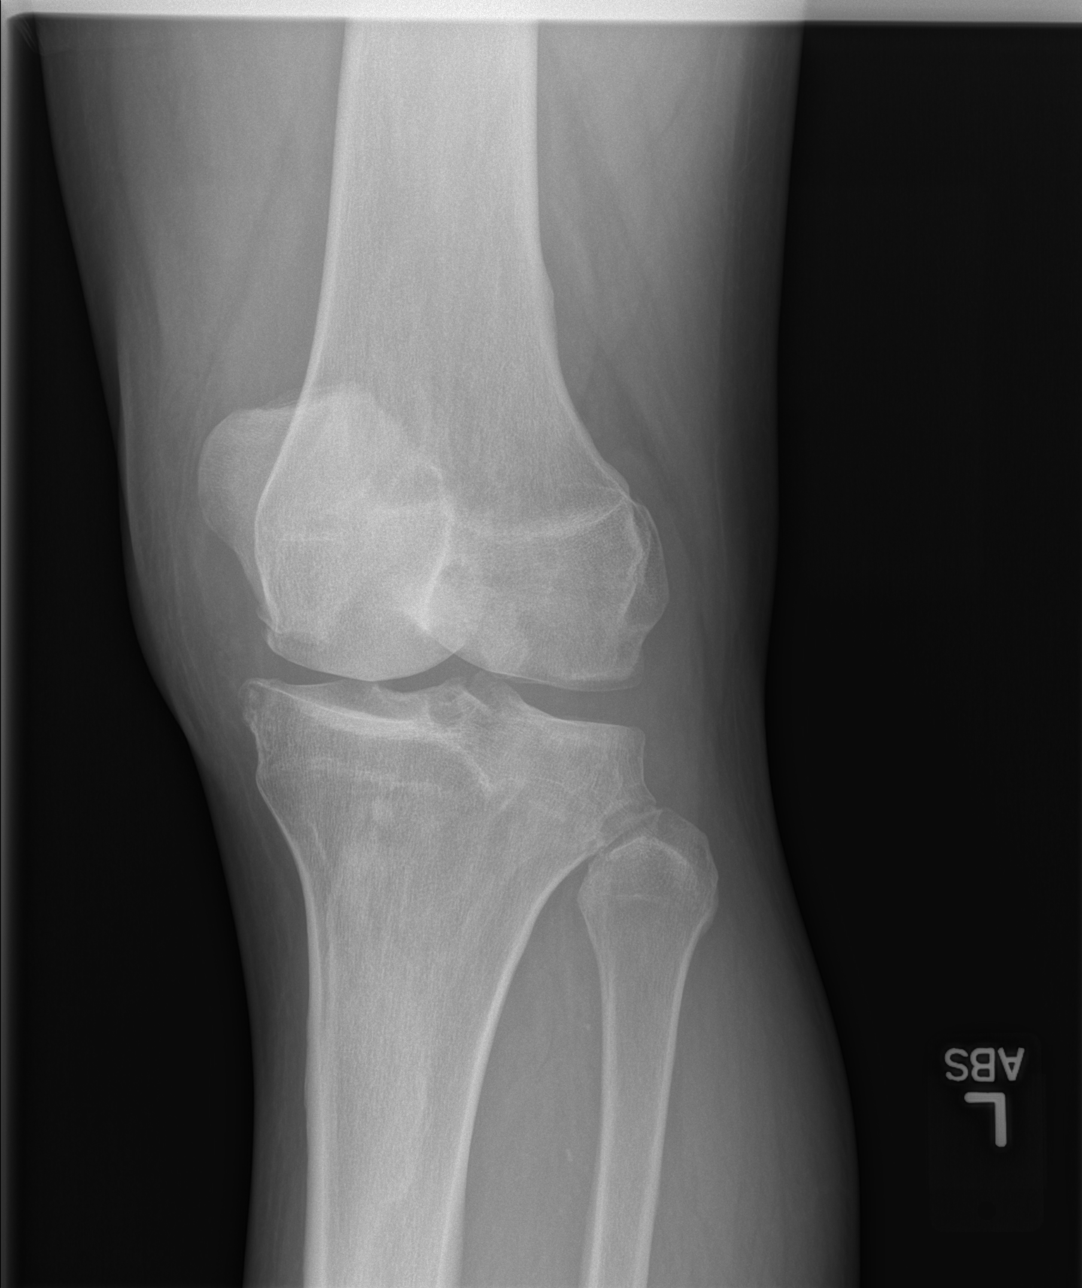
[im 3/4]
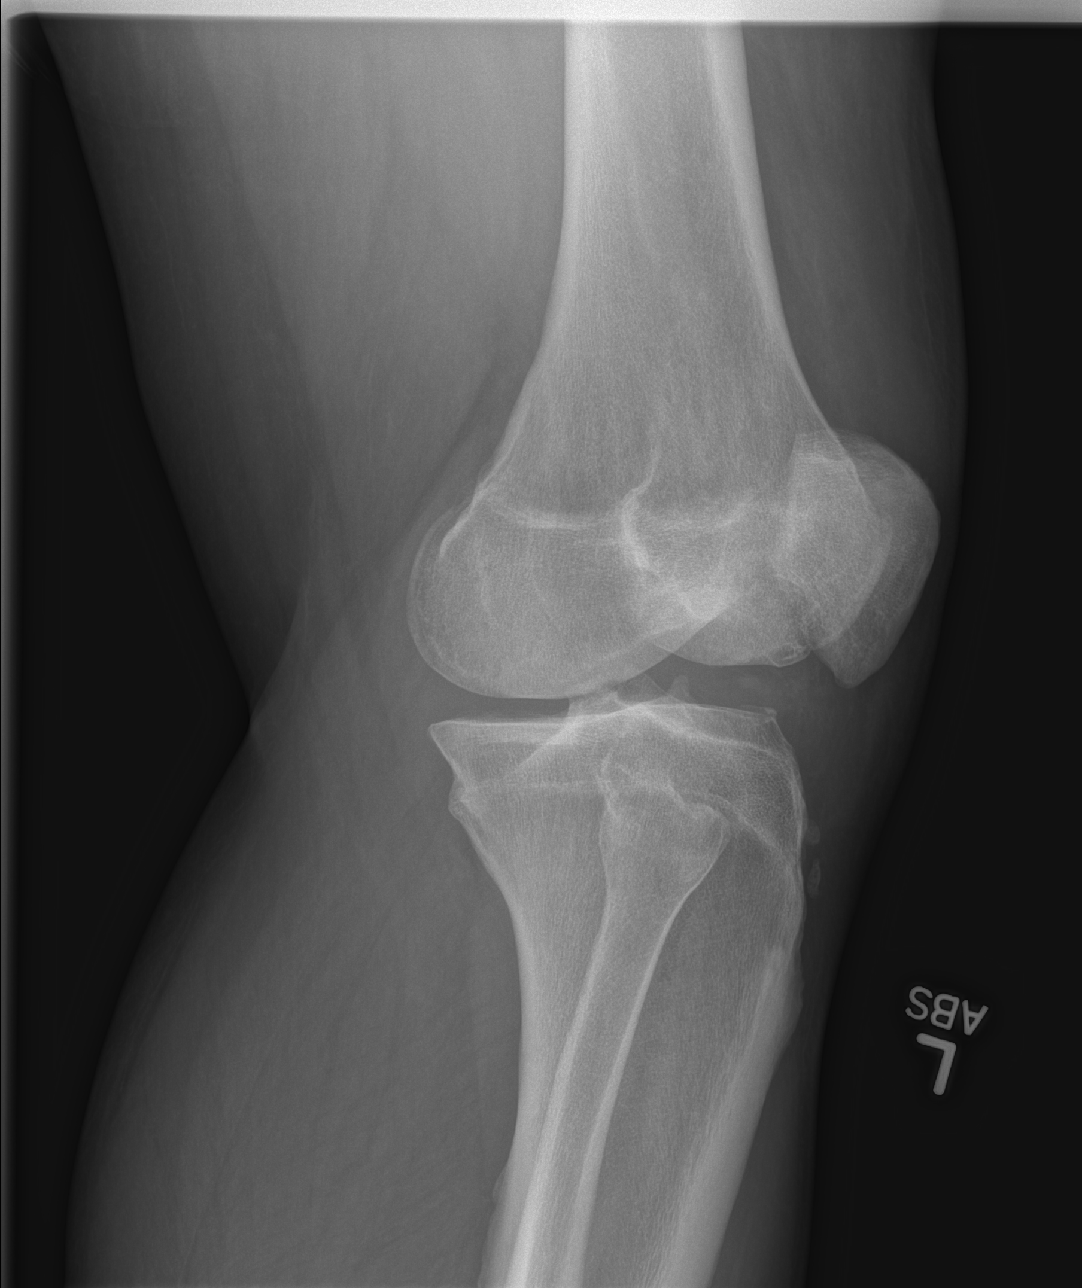
[im 4/4]
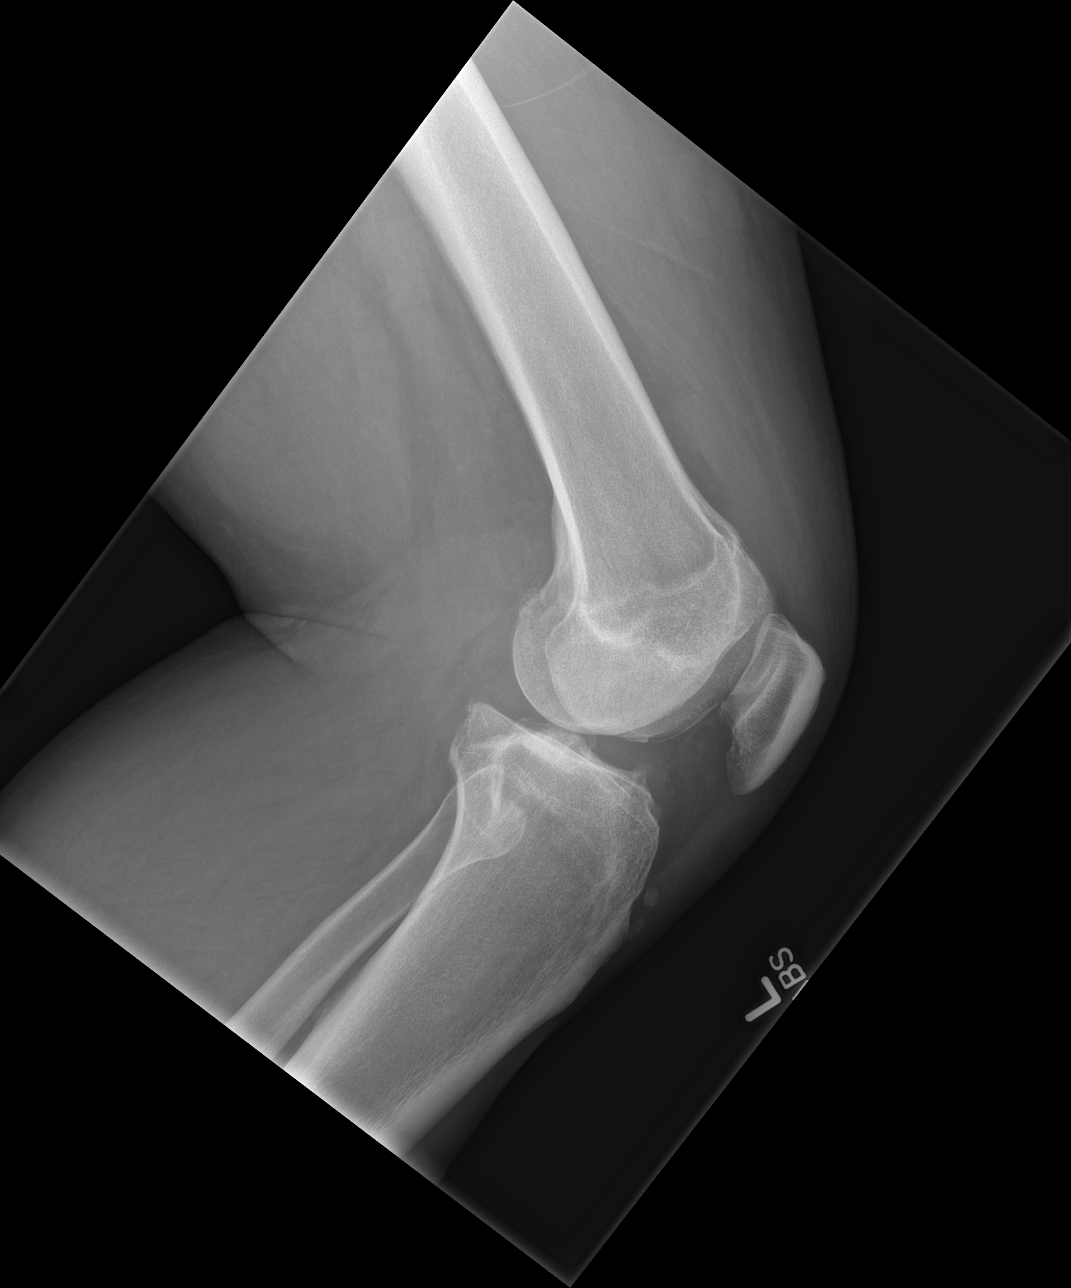

[4 of 4 positions shown; findings below may reference images not displayed]

FINDINGS: Degenerative changes are noted particularly in the medial joint
space. No joint effusion is seen. No acute fracture or dislocation
is noted. No soft tissue abnormality is seen.
IMPRESSION: Mild degenerative change without acute abnormality.

## 2018-11-04 IMAGING — US US ART/VEN ABD/PELV/SCROTUM DOPPLER LTD
1 series · 13 of 25 positions shown · non-contrast
Comparison: None.

CLINICAL DATA: Initial evaluation for acute swelling, pain. Status
post recent abscess drainage.

EXAM:
ULTRASOUND OF SCROTUM
TECHNIQUE: Complete ultrasound examination of the testicles, epididymis, and
other scrotal structures was performed.

[Series 1: us art/ven abd/pelv/scrotum doppler ltd · 0.08mm/px · 82 acquisitions, 13 frames shown]
[im 1/82]
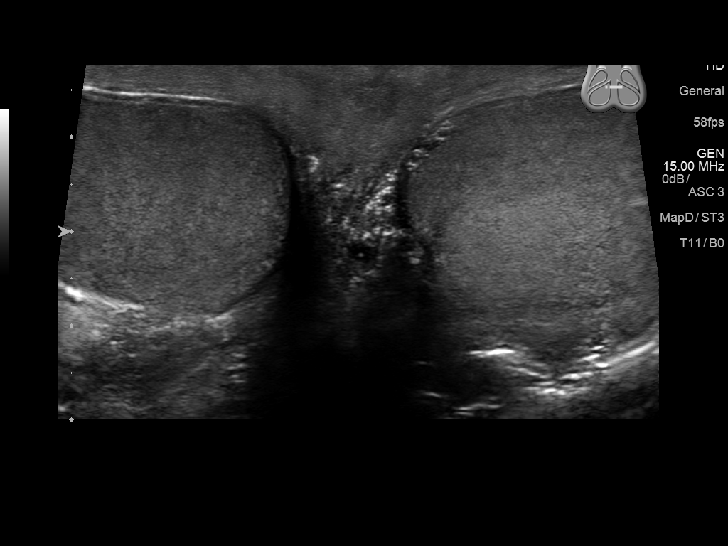
[im 7/82]
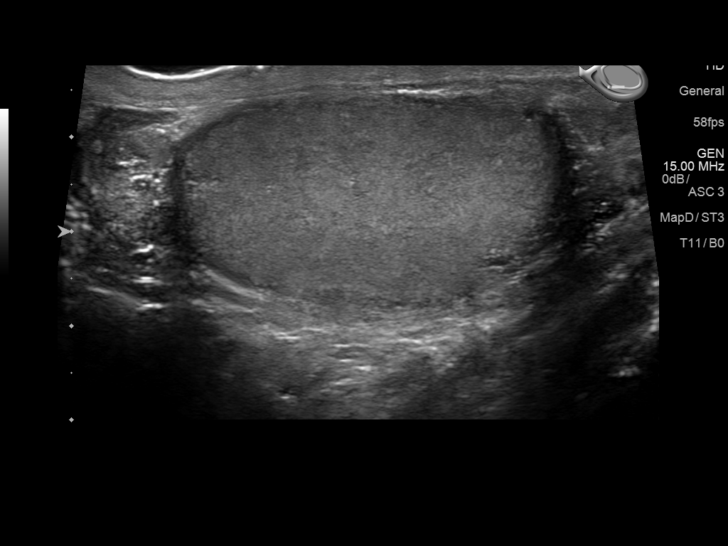
[im 14/82]
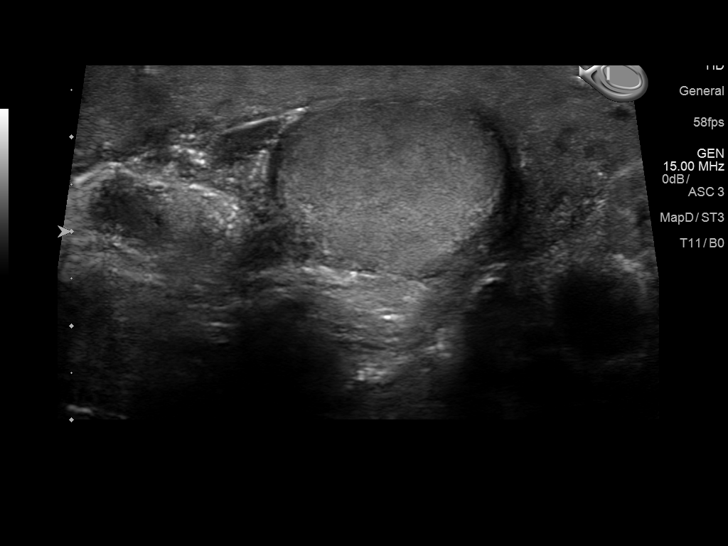
[im 21/82]
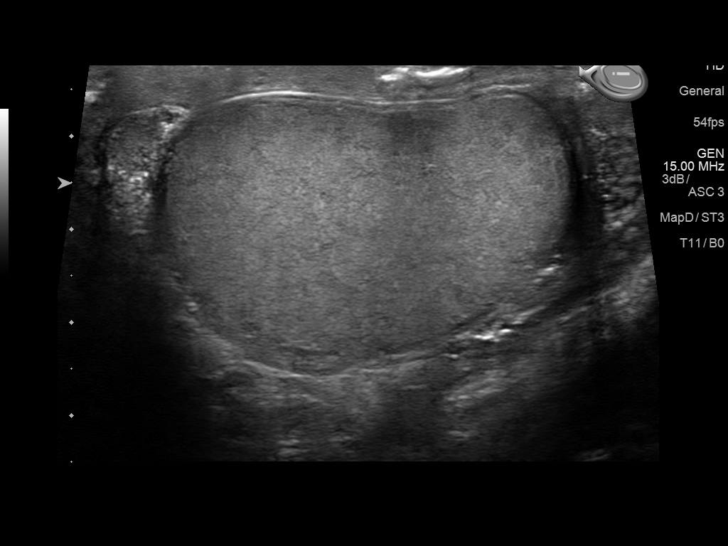
[im 28/82]
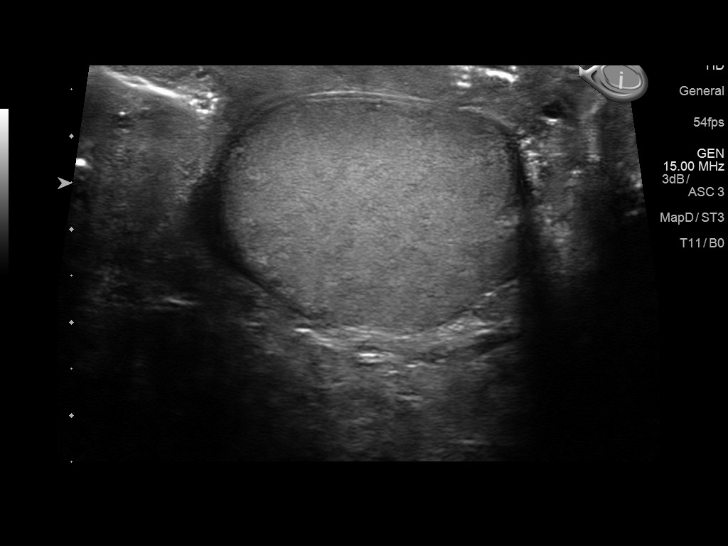
[im 34/82]
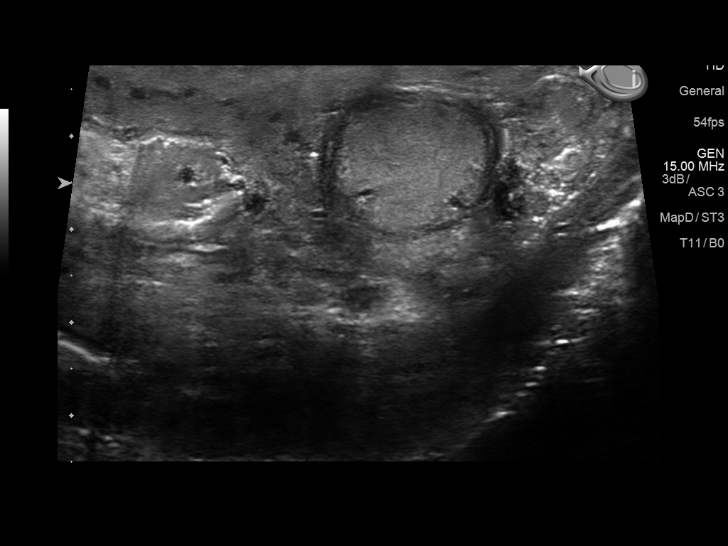
[im 41/82]
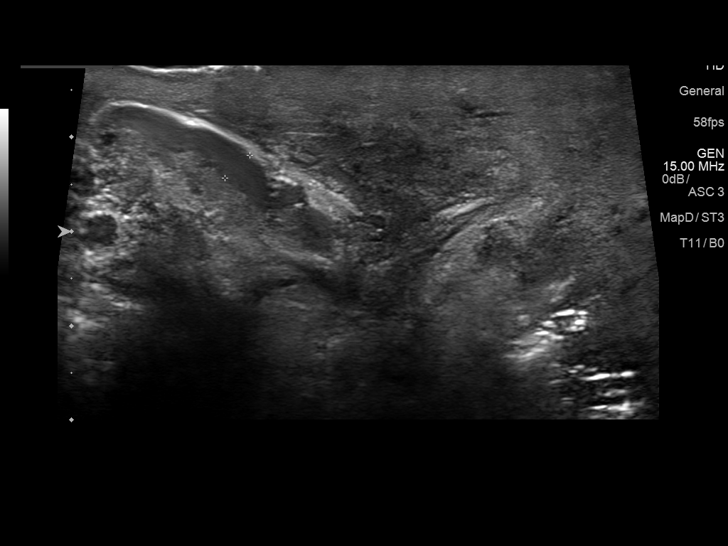
[im 48/82]
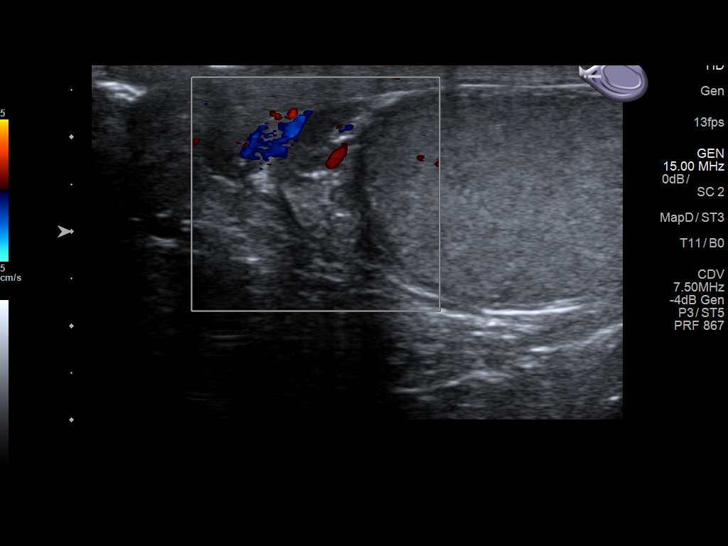
[im 55/82]
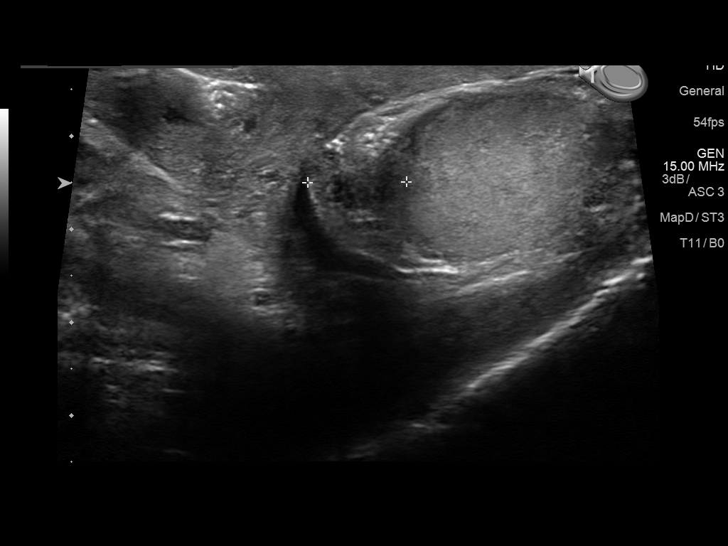
[im 61/82]
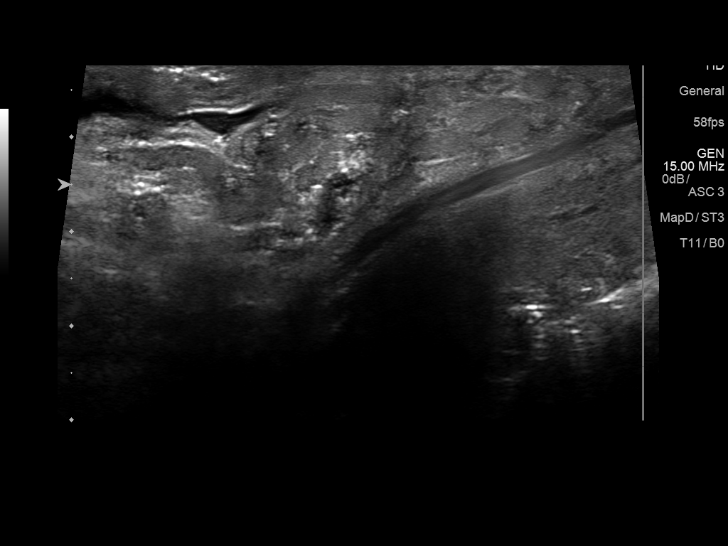
[im 68/82]
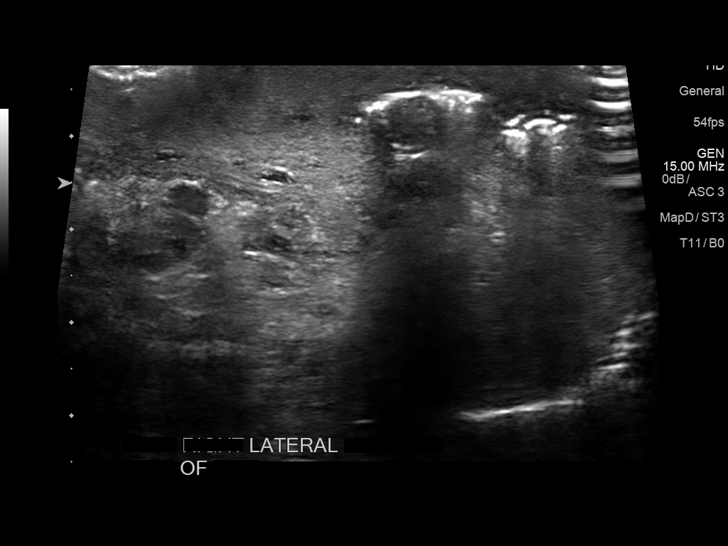
[im 75/82]
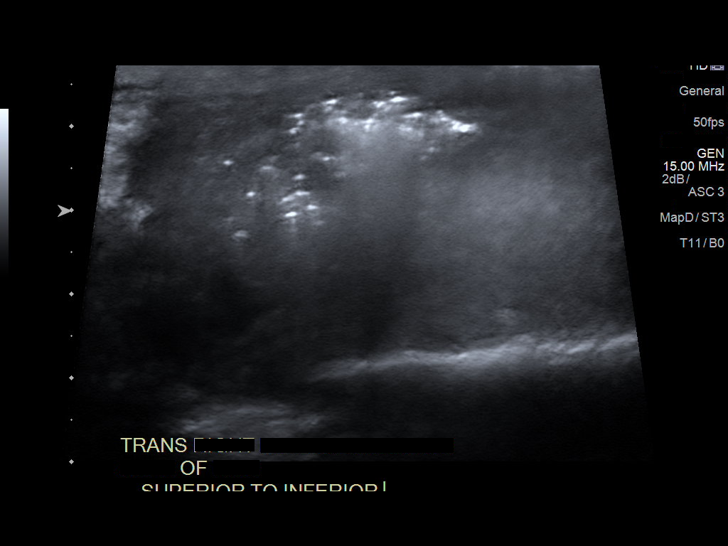
[im 82/82]
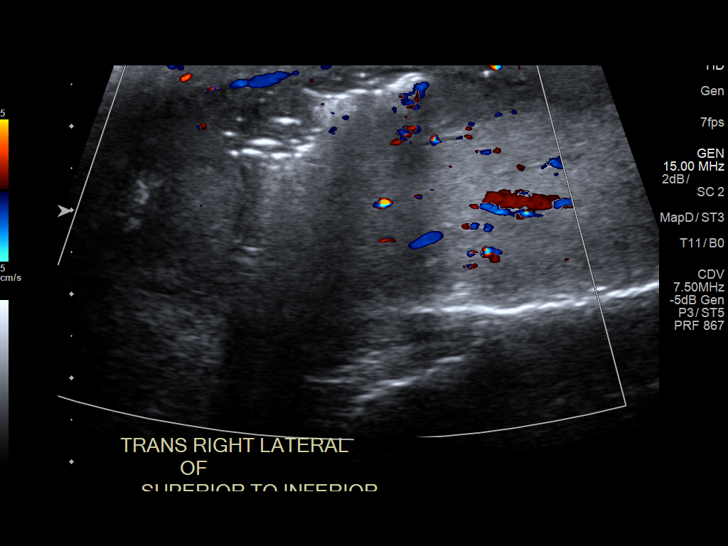

[13 of 25 positions shown; findings below may reference images not displayed]

FINDINGS: Right testicle

Measurements: 5.0 x 2.7 x 3.0 cm. No mass or microlithiasis
visualized.

Left testicle

Measurements: 4.3 x 2.5 x 3.2 cm. No mass or microlithiasis
visualized.

Right epididymis: Normal in size. Mildly heterogeneous without
discrete lesion.

Left epididymis: Normal in size. Mildly heterogeneous without
discrete lesion.

Hydrocele:  None visualized.

Varicocele:  Small bilateral varicoceles.

At the lateral posterior aspect of the right scrotum at site of
pain, there this complex echogenicity with scattered echogenic foci,
likely gas. Changes suspected to be related to recent procedure/
abscess strain age. No discrete or drainable collection identified
on today's exam.
IMPRESSION: 1. Focal complex area at the right lateral/posterior aspect of the
scrotum in region of recently drained abscess. Finding may reflect
post interventional changes and/or infection. Scattered echogenic
foci within this region likely reflect air, suspected to be related
to recent procedure. No discrete or drainable collection identified
on today's exam.
2. Bilateral varicoceles.
3. Normal sonographic appearance of the testes.
